# Patient Record
Sex: Female | Born: 1940
Health system: Southern US, Community
[De-identification: ages and names within clinical notes are randomized; demographics above are authoritative.]

## PROBLEM LIST (undated history)

## (undated) DIAGNOSIS — D649 Anemia, unspecified: Secondary | ICD-10-CM

## (undated) DIAGNOSIS — M1990 Unspecified osteoarthritis, unspecified site: Secondary | ICD-10-CM

## (undated) DIAGNOSIS — H269 Unspecified cataract: Secondary | ICD-10-CM

## (undated) DIAGNOSIS — K2270 Barrett's esophagus without dysplasia: Secondary | ICD-10-CM

## (undated) DIAGNOSIS — Z8489 Family history of other specified conditions: Secondary | ICD-10-CM

## (undated) DIAGNOSIS — K449 Diaphragmatic hernia without obstruction or gangrene: Secondary | ICD-10-CM

## (undated) DIAGNOSIS — M8580 Other specified disorders of bone density and structure, unspecified site: Secondary | ICD-10-CM

## (undated) DIAGNOSIS — K219 Gastro-esophageal reflux disease without esophagitis: Secondary | ICD-10-CM

## (undated) DIAGNOSIS — J45909 Unspecified asthma, uncomplicated: Secondary | ICD-10-CM

## (undated) DIAGNOSIS — K635 Polyp of colon: Secondary | ICD-10-CM

## (undated) HISTORY — DX: Gastro-esophageal reflux disease without esophagitis: K21.9

## (undated) HISTORY — DX: Polyp of colon: K63.5

## (undated) HISTORY — DX: Other specified disorders of bone density and structure, unspecified site: M85.80

## (undated) HISTORY — PX: EYE SURGERY: SHX253

## (undated) HISTORY — PX: CATARACT EXTRACTION: SUR2

## (undated) HISTORY — PX: MENISCUS REPAIR: SHX5179

## (undated) HISTORY — DX: Unspecified osteoarthritis, unspecified site: M19.90

## (undated) HISTORY — DX: Anemia, unspecified: D64.9

## (undated) HISTORY — DX: Unspecified cataract: H26.9

## (undated) HISTORY — DX: Unspecified asthma, uncomplicated: J45.909

## (undated) HISTORY — DX: Barrett's esophagus without dysplasia: K22.70

## (undated) HISTORY — DX: Diaphragmatic hernia without obstruction or gangrene: K44.9

## (undated) HISTORY — PX: TUBAL LIGATION: SHX77

---

## 2001-02-14 ENCOUNTER — Encounter: Admission: RE | Admit: 2001-02-14 | Discharge: 2001-02-14 | Payer: Self-pay | Admitting: Otolaryngology

## 2001-02-14 ENCOUNTER — Encounter: Payer: Self-pay | Admitting: Otolaryngology

## 2001-02-16 ENCOUNTER — Encounter: Payer: Self-pay | Admitting: Emergency Medicine

## 2001-02-16 ENCOUNTER — Encounter: Admission: RE | Admit: 2001-02-16 | Discharge: 2001-02-16 | Payer: Self-pay | Admitting: Emergency Medicine

## 2001-04-11 ENCOUNTER — Encounter (INDEPENDENT_AMBULATORY_CARE_PROVIDER_SITE_OTHER): Payer: Self-pay

## 2001-04-11 ENCOUNTER — Ambulatory Visit (HOSPITAL_COMMUNITY): Admission: RE | Admit: 2001-04-11 | Discharge: 2001-04-11 | Payer: Self-pay | Admitting: Gastroenterology

## 2002-02-21 ENCOUNTER — Encounter: Admission: RE | Admit: 2002-02-21 | Discharge: 2002-02-21 | Payer: Self-pay | Admitting: Emergency Medicine

## 2002-02-21 ENCOUNTER — Encounter: Payer: Self-pay | Admitting: Emergency Medicine

## 2003-03-07 ENCOUNTER — Encounter: Payer: Self-pay | Admitting: Emergency Medicine

## 2003-03-07 ENCOUNTER — Encounter: Admission: RE | Admit: 2003-03-07 | Discharge: 2003-03-07 | Payer: Self-pay | Admitting: Emergency Medicine

## 2004-03-07 ENCOUNTER — Encounter: Admission: RE | Admit: 2004-03-07 | Discharge: 2004-03-07 | Payer: Self-pay | Admitting: Emergency Medicine

## 2005-04-13 ENCOUNTER — Other Ambulatory Visit: Admission: RE | Admit: 2005-04-13 | Discharge: 2005-04-13 | Payer: Self-pay | Admitting: Emergency Medicine

## 2005-04-14 ENCOUNTER — Encounter: Admission: RE | Admit: 2005-04-14 | Discharge: 2005-04-14 | Payer: Self-pay | Admitting: Emergency Medicine

## 2006-05-26 ENCOUNTER — Encounter: Admission: RE | Admit: 2006-05-26 | Discharge: 2006-05-26 | Payer: Self-pay | Admitting: Emergency Medicine

## 2006-09-17 ENCOUNTER — Other Ambulatory Visit: Admission: RE | Admit: 2006-09-17 | Discharge: 2006-09-17 | Payer: Self-pay | Admitting: Emergency Medicine

## 2007-06-28 ENCOUNTER — Encounter: Admission: RE | Admit: 2007-06-28 | Discharge: 2007-06-28 | Payer: Self-pay | Admitting: Emergency Medicine

## 2008-08-15 ENCOUNTER — Encounter: Admission: RE | Admit: 2008-08-15 | Discharge: 2008-08-15 | Payer: Self-pay | Admitting: Emergency Medicine

## 2008-08-17 ENCOUNTER — Encounter: Admission: RE | Admit: 2008-08-17 | Discharge: 2008-08-17 | Payer: Self-pay | Admitting: Emergency Medicine

## 2009-10-17 ENCOUNTER — Encounter: Admission: RE | Admit: 2009-10-17 | Discharge: 2009-10-17 | Payer: Self-pay | Admitting: Family Medicine

## 2010-09-15 ENCOUNTER — Other Ambulatory Visit: Payer: Self-pay | Admitting: Nurse Practitioner

## 2010-09-15 DIAGNOSIS — Z1231 Encounter for screening mammogram for malignant neoplasm of breast: Secondary | ICD-10-CM

## 2010-10-03 NOTE — Op Note (Signed)
[facility]  Patient:    Tammy Harrell, Tammy Harrell Visit Number: [id number] MRN: [id number]          Service Type: END Location: ENDO Attending Physician:  Louie Bun Dictated by:   Everardo All Madilyn Fireman, M.D. Proc. Date: 04/11/01 Admit Date:  04/11/2001   CC:         Oley Balm. Georgina Pillion, M.D.   Operative Report  PROCEDURE:  Esophagogastroduodenoscopy with biopsy.  ENDOSCOPIST:  Everardo All. Madilyn Fireman, M.D.  INDICATIONS:  The patient has chronic reflux, known large hiatal hernia and reports of spitting or regurgitating some blood streaked material.  PROCEDURE:  Rule out refractory esophagitis, neoplasm of the GE junction or Barretts esophagus.  DESCRIPTION OF PROCEDURE:  The patient was placed in the left lateral decubitus position on the pulse monitor with continuous low flow oxygen delivered by nasal cannula.  She was sedated with 15 mg of IV Demerol and 6 mg of IV Versed.  The Olympus video endoscope was advanced under direct vision into the oropharynx and esophagus.  The esophagus was straight proximally and of normal caliber with the squamocolumnar line irregular in contour over a span of 35-38 cm from the incisors.  There appeared to be a hiatal hernia although the level of the hiatus and LES was vague, and therefore it was unclear to me whether the patient had Barretts esophagus.  Biopsies were taken of the most proximal areas of columnar epithelium.  There was no distinct ring, stricture or visible esophagitis.  The stomach was entered, and a small amount of liquid secretions were suctioned from the fundus.  A retroflexed view of the cardia showed a patulous gastroesophageal junction. Again, it was not absolutely clear that this represented a hiatal hernia.  The fundus, body, antrum and pylorus all appeared normal.  The duodenum was entered and both bulb and second portion were well inspected and appeared to be within normal limits.  The scope was then  withdrawn, and the patient returned to the recovery room in stable condition.  She tolerated the procedure well, and there were no immediate complications.  IMPRESSION: 1. Possible Barretts esophagus. 2. Probable hiatal hernia.  PLAN: Await histology to determine whether intestinal metaplasia is present. Dictated by:   Everardo All Madilyn Fireman, M.D. Attending Physician:  Louie Bun DD:  04/11/01 TD:  04/11/01 Job: [id number]

## 2010-10-03 NOTE — Procedures (Signed)
[facility]  Patient:    Tammy Harrell, Tammy Harrell Visit Number: [id number] MRN: [id number]          Service Type: END Location: ENDO Attending Physician:  Louie Bun Dictated by:   Everardo All Madilyn Fireman, M.D. Proc. Date: 04/11/01 Admit Date:  04/11/2001   CC:         Oley Balm. Georgina Pillion, M.D.   Procedure Report  PROCEDURE:  Colonoscopy with polypectomy.  INDICATION FOR PROCEDURE:  Screening colonoscopy in a patient undergoing EGD today.  DESCRIPTION OF PROCEDURE:  The patient was placed in the left lateral decubitus position and placed on the pulse monitor with continuous low-flow oxygen delivered by nasal cannula.  She was sedated with 20 mg of IV Demerol and 1 mg of IV Versed in addition to the medication received for the EGD.  The Olympus video colonoscope was inserted into the rectum and advanced to the cecum, confirmed by transillumination at McBurneys point and visualization of the ileocecal valve and appendiceal orifice.  The prep was excellent.  The cecum, ascending and transverse all appeared normal with no masses, polyps, diverticula or other mucosal abnormalities.  Within the descending and sigmoid colon, there were seen numerous diverticula and no other abnormalities. Within the rectum, there was a 6 mm polyp at 20 cm which was fulgurated by hot biopsy.  The remainder of the rectum appeared normal.  The colonoscope was then withdrawn, and the patient returned to the recovery room in stable condition.  She tolerated the procedure well, and there were no immediate complications.  IMPRESSION: 1. Small rectal polyp. 2. Left-sided diverticulosis.  PLAN:  Await biopsy results for determination of method and interval for future colon screening. Dictated by:   Everardo All Madilyn Fireman, M.D. Attending Physician:  Louie Bun DD:  04/11/01 TD:  04/11/01 Job: [id number]

## 2010-10-20 ENCOUNTER — Ambulatory Visit
Admission: RE | Admit: 2010-10-20 | Discharge: 2010-10-20 | Disposition: A | Discharge status: Home / Self Care | Payer: Medicare Other | Source: Ambulatory Visit | Attending: Nurse Practitioner | Admitting: Nurse Practitioner

## 2010-10-20 DIAGNOSIS — Z1231 Encounter for screening mammogram for malignant neoplasm of breast: Secondary | ICD-10-CM

## 2011-09-01 ENCOUNTER — Other Ambulatory Visit: Payer: Self-pay | Admitting: Gastroenterology

## 2011-10-26 ENCOUNTER — Other Ambulatory Visit: Payer: Self-pay | Admitting: Family Medicine

## 2011-10-26 DIAGNOSIS — Z1231 Encounter for screening mammogram for malignant neoplasm of breast: Secondary | ICD-10-CM

## 2011-11-03 ENCOUNTER — Ambulatory Visit
Admission: RE | Admit: 2011-11-03 | Discharge: 2011-11-03 | Disposition: A | Discharge status: Home / Self Care | Payer: Medicare Other | Source: Ambulatory Visit | Attending: Family Medicine | Admitting: Family Medicine

## 2011-11-03 DIAGNOSIS — Z1231 Encounter for screening mammogram for malignant neoplasm of breast: Secondary | ICD-10-CM

## 2011-11-26 ENCOUNTER — Other Ambulatory Visit: Payer: Self-pay | Admitting: Family Medicine

## 2011-11-26 DIAGNOSIS — E041 Nontoxic single thyroid nodule: Secondary | ICD-10-CM

## 2011-12-01 ENCOUNTER — Ambulatory Visit
Admission: RE | Admit: 2011-12-01 | Discharge: 2011-12-01 | Disposition: A | Discharge status: Home / Self Care | Payer: Medicare Other | Source: Ambulatory Visit | Attending: Family Medicine | Admitting: Family Medicine

## 2011-12-01 DIAGNOSIS — E041 Nontoxic single thyroid nodule: Secondary | ICD-10-CM

## 2011-12-03 ENCOUNTER — Other Ambulatory Visit: Payer: Self-pay | Admitting: Family Medicine

## 2011-12-03 DIAGNOSIS — E041 Nontoxic single thyroid nodule: Secondary | ICD-10-CM

## 2011-12-09 ENCOUNTER — Other Ambulatory Visit (HOSPITAL_COMMUNITY)
Admission: RE | Admit: 2011-12-09 | Discharge: 2011-12-09 | Disposition: A | Discharge status: Home / Self Care | Payer: Medicare Other | Source: Ambulatory Visit | Attending: Diagnostic Radiology | Admitting: Diagnostic Radiology

## 2011-12-09 ENCOUNTER — Ambulatory Visit
Admission: RE | Admit: 2011-12-09 | Discharge: 2011-12-09 | Disposition: A | Discharge status: Home / Self Care | Payer: Medicare Other | Source: Ambulatory Visit | Attending: Family Medicine | Admitting: Family Medicine

## 2011-12-09 DIAGNOSIS — E049 Nontoxic goiter, unspecified: Secondary | ICD-10-CM | POA: Insufficient documentation

## 2011-12-09 DIAGNOSIS — E041 Nontoxic single thyroid nodule: Secondary | ICD-10-CM

## 2012-12-01 ENCOUNTER — Other Ambulatory Visit: Payer: Self-pay

## 2012-12-01 DIAGNOSIS — Z1231 Encounter for screening mammogram for malignant neoplasm of breast: Secondary | ICD-10-CM

## 2012-12-30 ENCOUNTER — Ambulatory Visit
Admission: RE | Admit: 2012-12-30 | Discharge: 2012-12-30 | Disposition: A | Discharge status: Home / Self Care | Payer: Medicare Other | Source: Ambulatory Visit

## 2012-12-30 DIAGNOSIS — Z1231 Encounter for screening mammogram for malignant neoplasm of breast: Secondary | ICD-10-CM

## 2013-01-06 ENCOUNTER — Other Ambulatory Visit: Payer: Self-pay | Admitting: Physician Assistant

## 2013-08-24 ENCOUNTER — Other Ambulatory Visit: Payer: Self-pay | Admitting: Family Medicine

## 2013-08-24 ENCOUNTER — Ambulatory Visit
Admission: RE | Admit: 2013-08-24 | Discharge: 2013-08-24 | Disposition: A | Discharge status: Home / Self Care | Payer: Commercial Managed Care - HMO | Source: Ambulatory Visit | Attending: Family Medicine | Admitting: Family Medicine

## 2013-08-24 DIAGNOSIS — M25531 Pain in right wrist: Secondary | ICD-10-CM

## 2013-09-23 IMAGING — US US SOFT TISSUE HEAD/NECK
1 series · 14 of 25 positions shown · non-contrast
Comparison: None.

CLINICAL DATA: Cyst or nodule.

THYROID ULTRASOUND
TECHNIQUE: Ultrasound examination of the thyroid gland and adjacent
soft tissues was performed.

[Series 1: us soft tissue head/neck · 0.08mm/px · 14 of 47 slices shown]
[im 1/47]
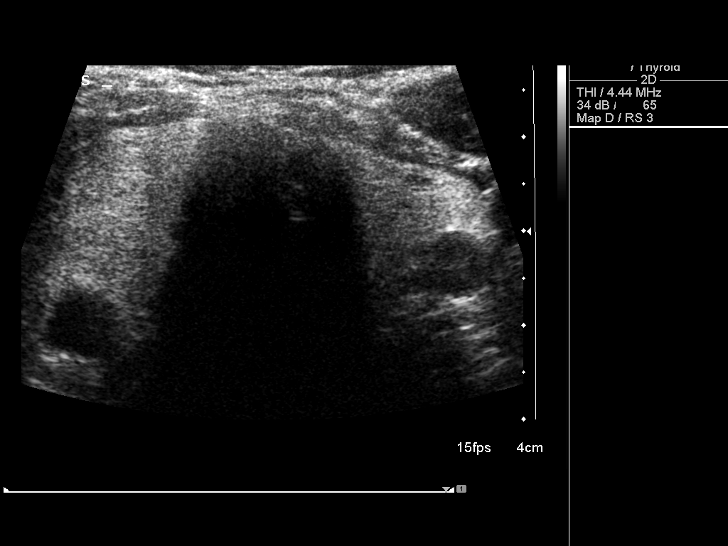
[im 4/47]
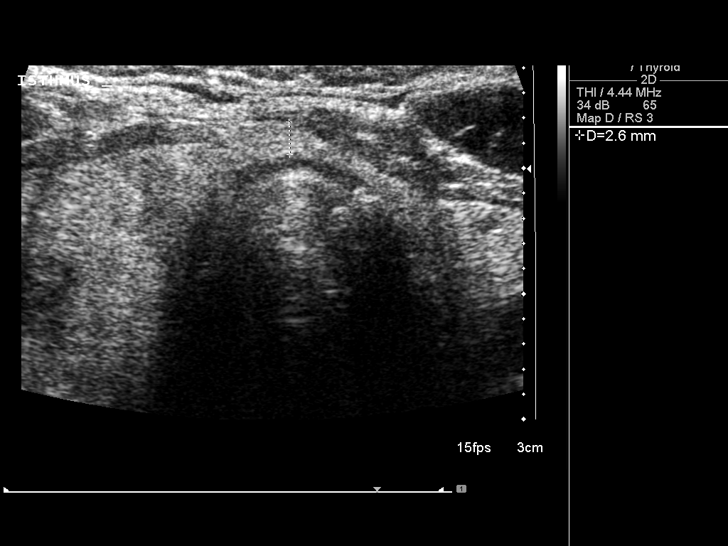
[im 8/47]
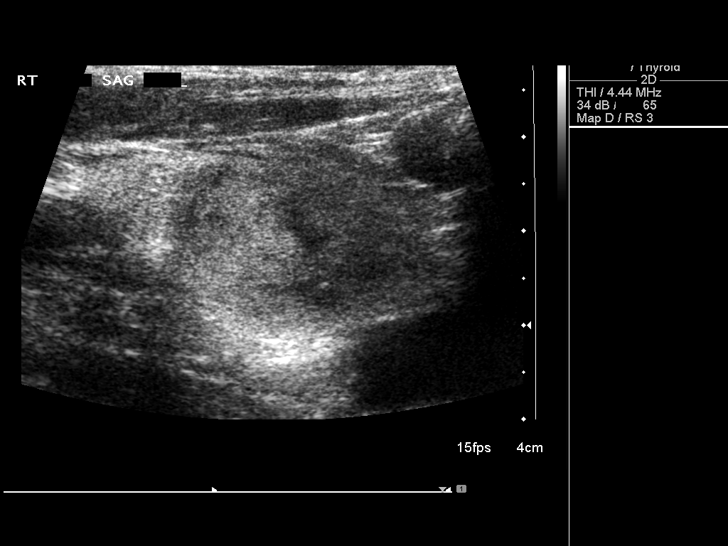
[im 12/47]
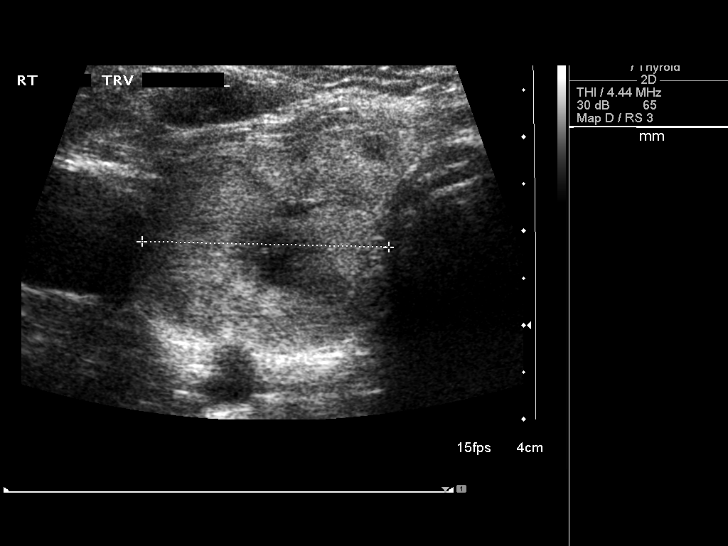
[im 16/47]
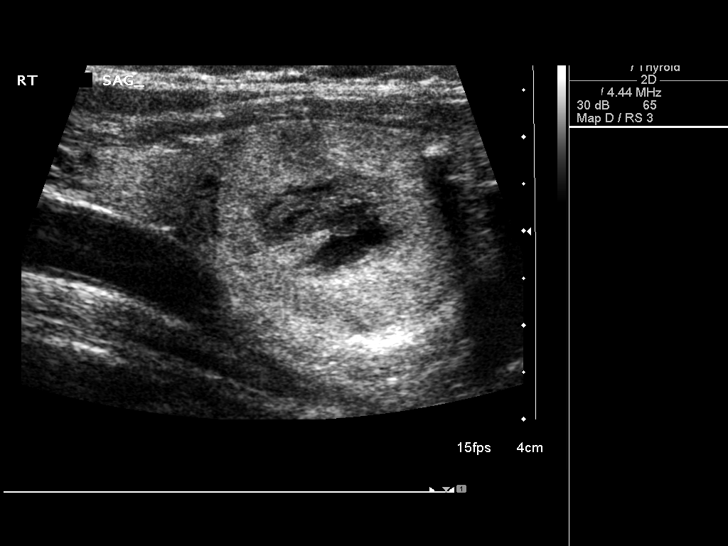
[im 18/47]
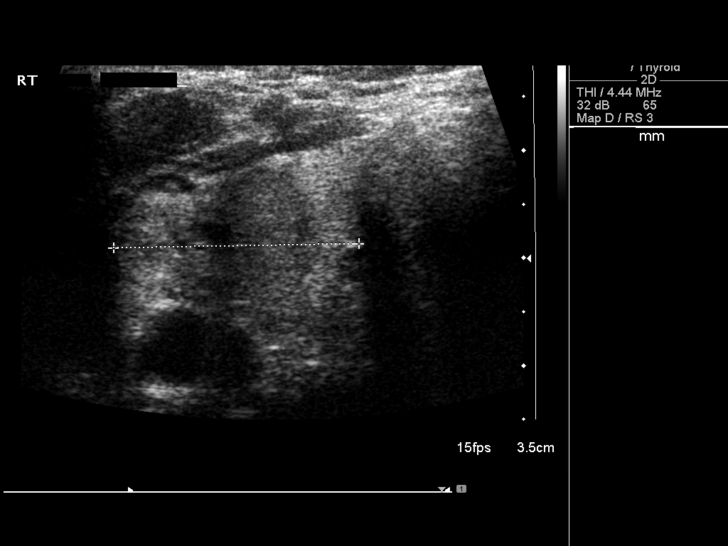
[im 22/47]
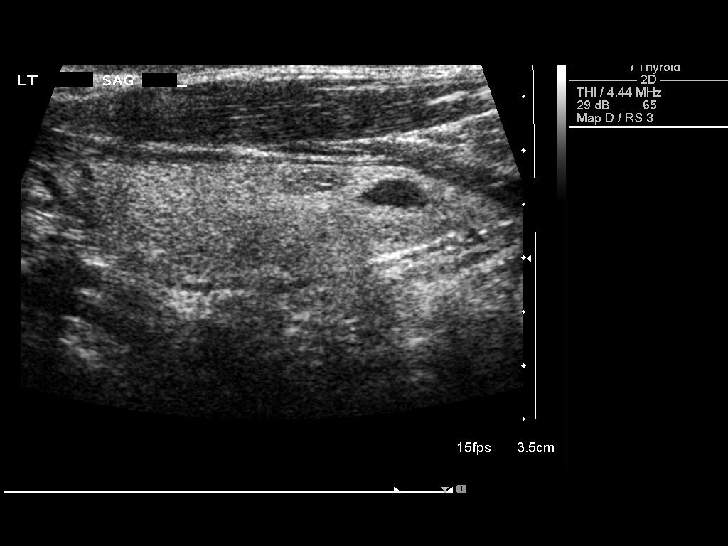
[im 25/47]
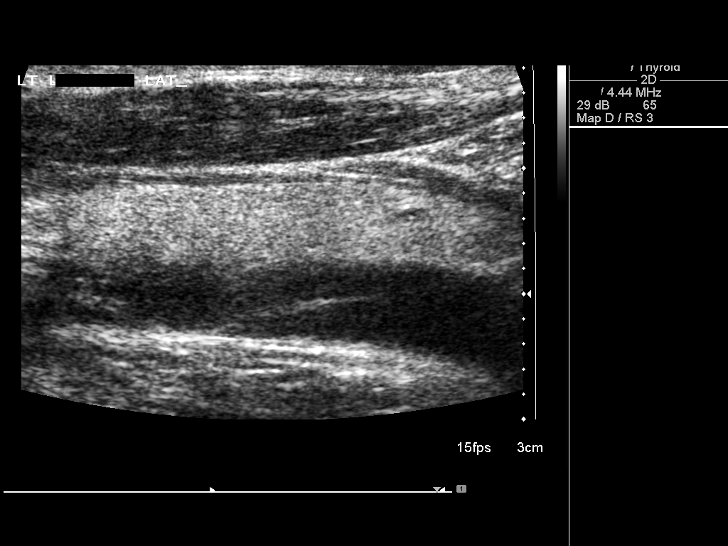
[im 29/47]
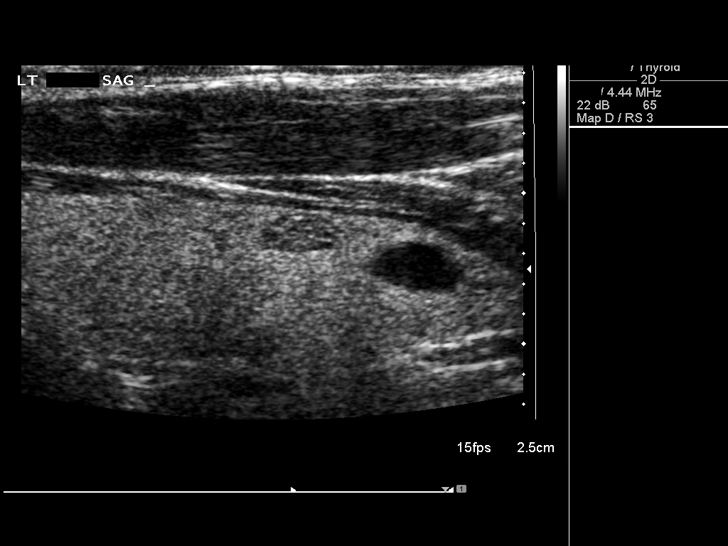
[im 31/47]
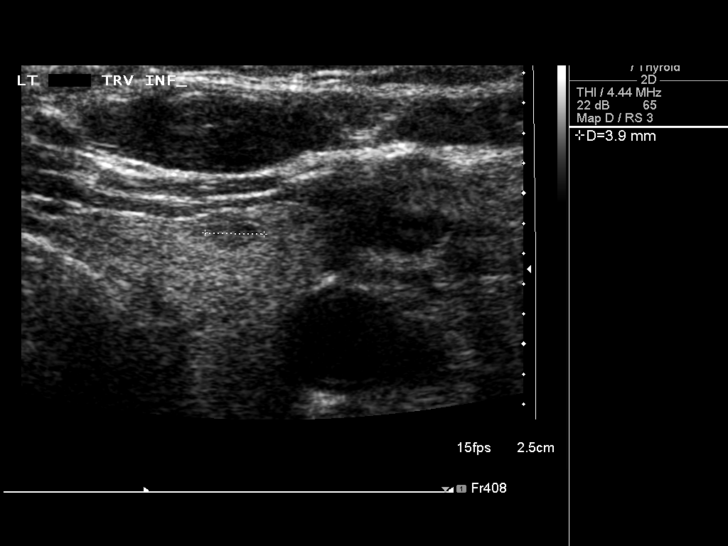
[im 35/47]
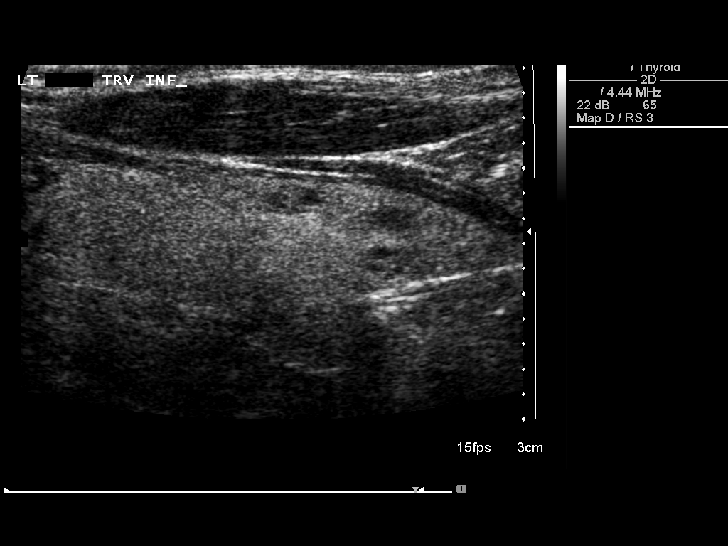
[im 39/47]
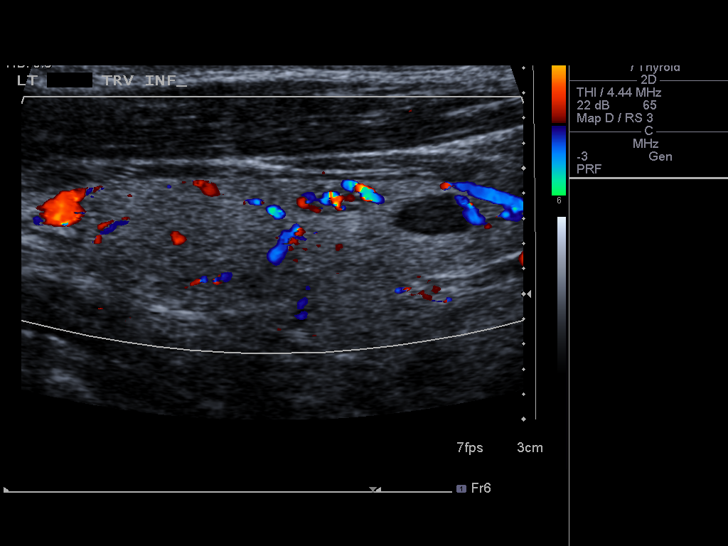
[im 43/47]
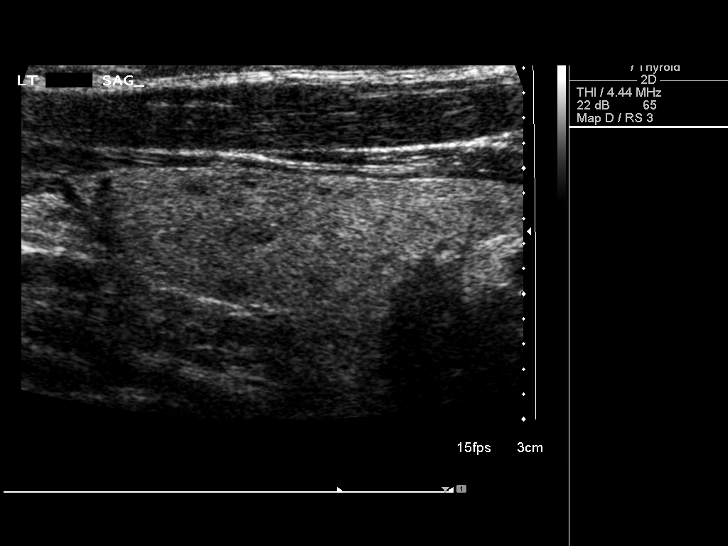
[im 47/47]
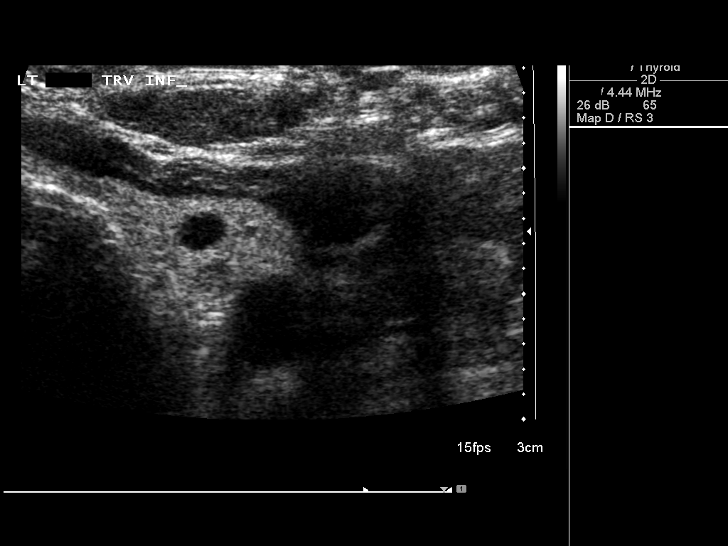

[14 of 25 positions shown; findings below may reference images not displayed]

FINDINGS: Right thyroid lobe:  23 x 27 x 48 mm, inhomogeneous background
echotexture
Left thyroid lobe:  13 x 17 x 42 mm
Isthmus:  3 mm in thickness

Focal nodules:  23 x 26 x 35 mm complex mostly solid with coarse
calcifications, inferior right
4 x 7 mm cyst, inferior left
Several smaller solid left lesions are identified measuring less
than 6 mm diameter.

Lymphadenopathy:  None visualized.
IMPRESSION: 1.  Dominant 3.5 cm right thyroid lesion. Findings meet consensus
criteria for biopsy.  Ultrasound-guided fine needle aspiration
should be considered, as per the consensus statement: Management of
Thyroid Nodules Detected at US:  [employer] in
800.

## 2013-10-18 ENCOUNTER — Ambulatory Visit (INDEPENDENT_AMBULATORY_CARE_PROVIDER_SITE_OTHER): Payer: Commercial Managed Care - HMO | Admitting: Podiatry

## 2013-10-18 ENCOUNTER — Encounter: Payer: Self-pay | Admitting: Podiatry

## 2013-10-18 VITALS — BP 148/76 | HR 75 | Resp 16

## 2013-10-18 DIAGNOSIS — L84 Corns and callosities: Secondary | ICD-10-CM

## 2013-10-18 NOTE — Progress Notes (Signed)
Subjective:     Patient ID: Tammy Harrell, female   DOB: Feb 14, 1941, 73 y.o.   MRN: [id number]  HPI patient has 3 lesions on the bottom of the left foot that are tender when pressed   Review of Systems     Objective:   Physical Exam Neurovascular status intact with keratotic lesion x3 plantar left    Assessment:     Keratotic lesion formation plantar left    Plan:     Debris lesions on left foot with no iatrogenic bleeding noted

## 2014-09-08 ENCOUNTER — Encounter (HOSPITAL_COMMUNITY): Payer: Self-pay | Admitting: Emergency Medicine

## 2014-09-08 ENCOUNTER — Emergency Department (HOSPITAL_COMMUNITY): Payer: PPO

## 2014-09-08 ENCOUNTER — Emergency Department (HOSPITAL_COMMUNITY)
Admission: EM | Admit: 2014-09-08 | Discharge: 2014-09-08 | Disposition: A | Discharge status: Home / Self Care | Payer: PPO | Attending: Emergency Medicine | Admitting: Emergency Medicine

## 2014-09-08 DIAGNOSIS — S299XXA Unspecified injury of thorax, initial encounter: Secondary | ICD-10-CM | POA: Diagnosis present

## 2014-09-08 DIAGNOSIS — R0789 Other chest pain: Secondary | ICD-10-CM

## 2014-09-08 DIAGNOSIS — Y998 Other external cause status: Secondary | ICD-10-CM | POA: Insufficient documentation

## 2014-09-08 DIAGNOSIS — Z9849 Cataract extraction status, unspecified eye: Secondary | ICD-10-CM | POA: Insufficient documentation

## 2014-09-08 DIAGNOSIS — Z8739 Personal history of other diseases of the musculoskeletal system and connective tissue: Secondary | ICD-10-CM | POA: Diagnosis not present

## 2014-09-08 DIAGNOSIS — Y92096 Garden or yard of other non-institutional residence as the place of occurrence of the external cause: Secondary | ICD-10-CM | POA: Diagnosis not present

## 2014-09-08 DIAGNOSIS — W010XXA Fall on same level from slipping, tripping and stumbling without subsequent striking against object, initial encounter: Secondary | ICD-10-CM | POA: Diagnosis not present

## 2014-09-08 DIAGNOSIS — Y93H2 Activity, gardening and landscaping: Secondary | ICD-10-CM | POA: Diagnosis not present

## 2014-09-08 DIAGNOSIS — S3992XA Unspecified injury of lower back, initial encounter: Secondary | ICD-10-CM | POA: Insufficient documentation

## 2014-09-08 DIAGNOSIS — Z8719 Personal history of other diseases of the digestive system: Secondary | ICD-10-CM | POA: Diagnosis not present

## 2014-09-08 DIAGNOSIS — W19XXXA Unspecified fall, initial encounter: Secondary | ICD-10-CM

## 2014-09-08 MED ORDER — TRAMADOL HCL 50 MG PO TABS: 50.0000 mg | ORAL_TABLET | Freq: Four times a day (QID) | ORAL | Status: DC | PRN

## 2014-09-08 MED ORDER — TRAMADOL HCL 50 MG PO TABS
50.0000 mg | ORAL_TABLET | Freq: Once | ORAL | Status: AC
  Administered 2014-09-08: 50 mg via ORAL
  Filled 2014-09-08: qty 1

## 2014-09-08 NOTE — ED Provider Notes (Signed)
CSN: [id number]     Arrival date & time 09/08/14  0023 History   First MD Initiated Contact with Patient 09/08/14 0129     Chief Complaint  Patient presents with  . Fall  . Back Pain    (Consider location/radiation/quality/duration/timing/severity/associated sxs/prior Treatment) HPI Comments: Patient is a 74 year old female with a history of esophageal reflux and osteopenia who presents to the emergency department for further evaluation of left-sided back pain. Patient states that she was mowing her lawn yesterday on a riding lawnmower when the mower tipped on its side causing her to fall on her left side. She states that she had no pain at the time of injury and denies hitting her head as well as losing consciousness. She was able to finish mowing the yard and continued on her day normally. Patient states that it wasn't until this evening that she was getting ready for bed and felt as though something "snapped". She states that she went to sleep and awoke 2 hours later to let her dog out. At this time, the pain acutely worsened. She has pain with certain position changes as well as with laughing. She denies pain with deep breathing as well as shortness of breath. No medications taken prior to arrival for symptoms. Patient has had no bowel/bladder incontinence, hematuria, or extremity numbness/weakness.  Patient is a 74 y.o. female presenting with fall and back pain. The history is provided by the patient. No language interpreter was used.  Fall  Back Pain   Past Medical History  Diagnosis Date  . Cataract   . GERD (gastroesophageal reflux disease)   . Osteopenia    Past Surgical History  Procedure Laterality Date  . Tubal ligation    . Eye surgery    . Cataract extraction     No family history on file. History  Substance Use Topics  . Smoking status: Never Smoker   . Smokeless tobacco: Not on file  . Alcohol Use: No   OB History    No data available      Review of Systems   Musculoskeletal: Positive for back pain.  All other systems reviewed and are negative.   Allergies  Codeine  Home Medications   Prior to Admission medications   Medication Sig Start Date End Date Taking? Authorizing Provider  traMADol (ULTRAM) 50 MG tablet Take 1 tablet (50 mg total) by mouth every 6 (six) hours as needed. 09/08/14   Antonietta Breach, PA-C   BP 125/78 mmHg  Pulse 70  Temp(Src) 97.9 F (36.6 C) (Oral)  Resp 14  Ht 5\' 6"  (1.676 m)  Wt 178 lb (80.74 kg)  BMI 28.74 kg/m2  SpO2 100%   Physical Exam  Constitutional: She is oriented to person, place, and time. She appears well-developed and well-nourished. No distress.  Pleasant and joking with daughter. Nontoxic/nonseptic appearing.  HENT:  Head: Normocephalic and atraumatic.  Eyes: Conjunctivae and EOM are normal. No scleral icterus.  Neck: Normal range of motion.  Cardiovascular: Normal rate, regular rhythm and intact distal pulses.   Pulmonary/Chest: Effort normal and breath sounds normal. No respiratory distress. She has no wheezes. She has no rales. She exhibits tenderness.  TTP to L lateral chest wall without crepitus or deformity. Chest expansion symmetric. Lungs CTAB.  Musculoskeletal: Normal range of motion. She exhibits tenderness.  See chest wall exam. No TTP to the thoracic or lumbar midline. No bony deformities, step offs, or crepitus.  Neurological: She is alert and oriented to person, place, and  time. She exhibits normal muscle tone. Coordination normal.  GCS 15. Speech is goal oriented. Patient moves extremities without ataxia.  Skin: Skin is warm and dry. No rash noted. She is not diaphoretic. No erythema. No pallor.  Psychiatric: She has a normal mood and affect. Her behavior is normal.  Nursing note and vitals reviewed.   ED Course  Procedures (including critical care time) Labs Review Labs Reviewed - No data to display  Imaging Review Dg Ribs Unilateral W/chest Left  09/08/2014   CLINICAL  DATA:  Left rib and back pain after falling from a lawnmower yesterday.  EXAM: LEFT RIBS AND CHEST - 3+ VIEW  COMPARISON:  None.  FINDINGS: No fracture or other bone lesions are seen involving the ribs. There is no evidence of pneumothorax or pleural effusion. There is slight scarring or atelectasis at the left lung base. Lungs are otherwise clear. Heart size and vascularity are normal.  IMPRESSION: Minimal scarring or atelectasis the left lung base. No visible rib fractures.   Electronically Signed   By: Lorriane Shire M.D.   On: 09/08/2014 02:19   Dg Lumbar Spine Complete  09/08/2014   CLINICAL DATA:  Status post lawnmower accident. Felt popping sensation at lower to mid back. Initial encounter.  EXAM: LUMBAR SPINE - COMPLETE 4+ VIEW  COMPARISON:  MRI of the lumbar spine performed 08/15/2008  FINDINGS: There is no evidence of fracture or subluxation. Vertebral bodies demonstrate normal height and alignment. Intervertebral disc spaces are preserved. The visualized neural foramina are grossly unremarkable in appearance.  The visualized bowel gas pattern is unremarkable in appearance; air and stool are noted within the colon. The sacroiliac joints are within normal limits.  IMPRESSION: No evidence of fracture or subluxation along the lumbar spine.   Electronically Signed   By: Garald Balding M.D.   On: 09/08/2014 02:18     EKG Interpretation None      MDM   Final diagnoses:  Fall  Left-sided chest wall pain    74 year old female presents to the emergency department for further evaluation of injuries following a fall. Patient denies head trauma or loss of consciousness during the fall. Patient is pleasant and well-appearing. She is neurovascularly intact. No red flags or signs concerning for cauda equina. Patient complains of pain to her left lateral chest wall. Lungs are clear to auscultation bilaterally. No hypoxia.  X-rays today showed no evidence of fracture, dislocation, or bony deformity. No  evidence of pneumothorax or rib fracture. Have discussed with the patient that she may have a hairline fracture to a rib, not visible on x-ray. Will manage as outpatient with pain medication and incentive spirometer to promote deep breathing. Primary care follow-up advised in one week and return precautions given. Patient agreeable to plan with no unaddressed concerns. Patient discharged in good condition; VSS.   Filed Vitals:   09/08/14 0051 09/08/14 0130 09/08/14 0230  BP: 161/71 139/51 125/78  Pulse: 68 61 70  Temp: 97.9 F (36.6 C)    TempSrc: Oral    Resp: 14    Height: 5\' 6"  (1.676 m)    Weight: 178 lb (80.74 kg)    SpO2: 97% 100% 100%     Antonietta Breach, PA-C 09/08/14 6808  Rolland Porter, MD 09/08/14 [phone]

## 2014-09-08 NOTE — Discharge Instructions (Signed)
Your chest x-ray does not show a rib fracture, though it is possible that you may still have a hairline fracture to one of your ribs. Recommend that you take ibuprofen or Tylenol for persistent pain. Alternate ice and heat to the area of injury. Use an incentive spirometer once every 1-2 hours to ensure that you are taking deep breaths. You may take tramadol as needed for severe pain. Follow-up with your doctor in 1 week for a recheck of symptoms. Return to the emergency department if symptoms worsen.  Chest Wall Pain Chest wall pain is pain in or around the bones and muscles of your chest. It may take up to 6 weeks to get better. It may take longer if you must stay physically active in your work and activities.  CAUSES  Chest wall pain may happen on its own. However, it may be caused by:  A viral illness like the flu.  Injury.  Coughing.  Exercise.  Arthritis.  Fibromyalgia.  Shingles. HOME CARE INSTRUCTIONS   Avoid overtiring physical activity. Try not to strain or perform activities that cause pain. This includes any activities using your chest or your abdominal and side muscles, especially if heavy weights are used.  Put ice on the sore area.  Put ice in a plastic bag.  Place a towel between your skin and the bag.  Leave the ice on for 15-20 minutes per hour while awake for the first 2 days.  Only take over-the-counter or prescription medicines for pain, discomfort, or fever as directed by your caregiver. SEEK IMMEDIATE MEDICAL CARE IF:   Your pain increases, or you are very uncomfortable.  You have a fever.  Your chest pain becomes worse.  You have new, unexplained symptoms.  You have nausea or vomiting.  You feel sweaty or lightheaded.  You have a cough with phlegm (sputum), or you cough up blood. MAKE SURE YOU:   Understand these instructions.  Will watch your condition.  Will get help right away if you are not doing well or get worse. Document Released:  05/04/2005 Document Revised: 07/27/2011 Document Reviewed: 12/29/2010 [facility] Patient Information 2015 [place], [place]. This information is not intended to replace advice given to you by your health care provider. Make sure you discuss any questions you have with your health care provider.

## 2014-09-08 NOTE — ED Notes (Signed)
Pt. fell from her riding mower yesterday , no LOC / ambulatory , reports pain at left lower back . Denies hematuria . Alert and oriented/ respirations unlabored .

## 2015-05-29 DIAGNOSIS — E559 Vitamin D deficiency, unspecified: Secondary | ICD-10-CM | POA: Diagnosis not present

## 2015-05-29 DIAGNOSIS — R7989 Other specified abnormal findings of blood chemistry: Secondary | ICD-10-CM | POA: Diagnosis not present

## 2015-06-04 DIAGNOSIS — K219 Gastro-esophageal reflux disease without esophagitis: Secondary | ICD-10-CM | POA: Diagnosis not present

## 2015-06-12 DIAGNOSIS — Z0000 Encounter for general adult medical examination without abnormal findings: Secondary | ICD-10-CM | POA: Diagnosis not present

## 2015-06-12 DIAGNOSIS — M8580 Other specified disorders of bone density and structure, unspecified site: Secondary | ICD-10-CM | POA: Diagnosis not present

## 2015-06-12 DIAGNOSIS — E7800 Pure hypercholesterolemia, unspecified: Secondary | ICD-10-CM | POA: Diagnosis not present

## 2015-07-02 DIAGNOSIS — R5383 Other fatigue: Secondary | ICD-10-CM | POA: Diagnosis not present

## 2015-07-11 DIAGNOSIS — R5383 Other fatigue: Secondary | ICD-10-CM | POA: Diagnosis not present

## 2015-08-05 DIAGNOSIS — R5383 Other fatigue: Secondary | ICD-10-CM | POA: Diagnosis not present

## 2015-09-10 DIAGNOSIS — R7989 Other specified abnormal findings of blood chemistry: Secondary | ICD-10-CM | POA: Diagnosis not present

## 2015-09-10 DIAGNOSIS — R5383 Other fatigue: Secondary | ICD-10-CM | POA: Diagnosis not present

## 2015-09-10 DIAGNOSIS — E559 Vitamin D deficiency, unspecified: Secondary | ICD-10-CM | POA: Diagnosis not present

## 2015-09-13 DIAGNOSIS — Z0100 Encounter for examination of eyes and vision without abnormal findings: Secondary | ICD-10-CM | POA: Diagnosis not present

## 2015-09-13 DIAGNOSIS — Z961 Presence of intraocular lens: Secondary | ICD-10-CM | POA: Diagnosis not present

## 2015-09-18 DIAGNOSIS — R5383 Other fatigue: Secondary | ICD-10-CM | POA: Diagnosis not present

## 2015-10-30 DIAGNOSIS — R5383 Other fatigue: Secondary | ICD-10-CM | POA: Diagnosis not present

## 2015-12-04 DIAGNOSIS — R5383 Other fatigue: Secondary | ICD-10-CM | POA: Diagnosis not present

## 2015-12-31 DIAGNOSIS — E559 Vitamin D deficiency, unspecified: Secondary | ICD-10-CM | POA: Diagnosis not present

## 2015-12-31 DIAGNOSIS — R7989 Other specified abnormal findings of blood chemistry: Secondary | ICD-10-CM | POA: Diagnosis not present

## 2015-12-31 DIAGNOSIS — R5383 Other fatigue: Secondary | ICD-10-CM | POA: Diagnosis not present

## 2016-01-08 DIAGNOSIS — R5383 Other fatigue: Secondary | ICD-10-CM | POA: Diagnosis not present

## 2016-02-11 DIAGNOSIS — R5383 Other fatigue: Secondary | ICD-10-CM | POA: Diagnosis not present

## 2016-02-24 DIAGNOSIS — M8588 Other specified disorders of bone density and structure, other site: Secondary | ICD-10-CM | POA: Diagnosis not present

## 2016-03-11 DIAGNOSIS — L821 Other seborrheic keratosis: Secondary | ICD-10-CM | POA: Diagnosis not present

## 2016-03-11 DIAGNOSIS — D239 Other benign neoplasm of skin, unspecified: Secondary | ICD-10-CM | POA: Diagnosis not present

## 2016-06-22 DIAGNOSIS — R7989 Other specified abnormal findings of blood chemistry: Secondary | ICD-10-CM | POA: Diagnosis not present

## 2016-06-22 DIAGNOSIS — R5383 Other fatigue: Secondary | ICD-10-CM | POA: Diagnosis not present

## 2016-06-22 DIAGNOSIS — E559 Vitamin D deficiency, unspecified: Secondary | ICD-10-CM | POA: Diagnosis not present

## 2016-06-22 DIAGNOSIS — N951 Menopausal and female climacteric states: Secondary | ICD-10-CM | POA: Diagnosis not present

## 2016-06-23 DIAGNOSIS — N951 Menopausal and female climacteric states: Secondary | ICD-10-CM | POA: Diagnosis not present

## 2016-06-23 DIAGNOSIS — R5383 Other fatigue: Secondary | ICD-10-CM | POA: Diagnosis not present

## 2016-07-01 DIAGNOSIS — R5383 Other fatigue: Secondary | ICD-10-CM | POA: Diagnosis not present

## 2016-08-12 DIAGNOSIS — R5383 Other fatigue: Secondary | ICD-10-CM | POA: Diagnosis not present

## 2016-09-04 DIAGNOSIS — H9011 Conductive hearing loss, unilateral, right ear, with unrestricted hearing on the contralateral side: Secondary | ICD-10-CM | POA: Diagnosis not present

## 2016-09-04 DIAGNOSIS — H6521 Chronic serous otitis media, right ear: Secondary | ICD-10-CM | POA: Diagnosis not present

## 2016-09-10 DIAGNOSIS — H6521 Chronic serous otitis media, right ear: Secondary | ICD-10-CM | POA: Diagnosis not present

## 2016-09-10 DIAGNOSIS — H9011 Conductive hearing loss, unilateral, right ear, with unrestricted hearing on the contralateral side: Secondary | ICD-10-CM | POA: Diagnosis not present

## 2016-09-18 DIAGNOSIS — H52203 Unspecified astigmatism, bilateral: Secondary | ICD-10-CM | POA: Diagnosis not present

## 2016-09-18 DIAGNOSIS — Z961 Presence of intraocular lens: Secondary | ICD-10-CM | POA: Diagnosis not present

## 2016-10-14 DIAGNOSIS — Z9622 Myringotomy tube(s) status: Secondary | ICD-10-CM | POA: Diagnosis not present

## 2016-10-14 DIAGNOSIS — H6521 Chronic serous otitis media, right ear: Secondary | ICD-10-CM | POA: Diagnosis not present

## 2016-10-14 DIAGNOSIS — H9011 Conductive hearing loss, unilateral, right ear, with unrestricted hearing on the contralateral side: Secondary | ICD-10-CM | POA: Diagnosis not present

## 2016-10-21 DIAGNOSIS — R5383 Other fatigue: Secondary | ICD-10-CM | POA: Diagnosis not present

## 2016-11-02 DIAGNOSIS — M8580 Other specified disorders of bone density and structure, unspecified site: Secondary | ICD-10-CM | POA: Diagnosis not present

## 2016-11-02 DIAGNOSIS — R5383 Other fatigue: Secondary | ICD-10-CM | POA: Diagnosis not present

## 2016-11-02 DIAGNOSIS — E7800 Pure hypercholesterolemia, unspecified: Secondary | ICD-10-CM | POA: Diagnosis not present

## 2016-11-02 DIAGNOSIS — R7989 Other specified abnormal findings of blood chemistry: Secondary | ICD-10-CM | POA: Diagnosis not present

## 2016-11-02 DIAGNOSIS — K219 Gastro-esophageal reflux disease without esophagitis: Secondary | ICD-10-CM | POA: Diagnosis not present

## 2016-11-02 DIAGNOSIS — Z0000 Encounter for general adult medical examination without abnormal findings: Secondary | ICD-10-CM | POA: Diagnosis not present

## 2016-11-02 DIAGNOSIS — E559 Vitamin D deficiency, unspecified: Secondary | ICD-10-CM | POA: Diagnosis not present

## 2016-11-11 DIAGNOSIS — R5383 Other fatigue: Secondary | ICD-10-CM | POA: Diagnosis not present

## 2016-12-08 DIAGNOSIS — D126 Benign neoplasm of colon, unspecified: Secondary | ICD-10-CM | POA: Diagnosis not present

## 2016-12-08 DIAGNOSIS — Z86010 Personal history of colonic polyps: Secondary | ICD-10-CM | POA: Diagnosis not present

## 2016-12-08 DIAGNOSIS — K5730 Diverticulosis of large intestine without perforation or abscess without bleeding: Secondary | ICD-10-CM | POA: Diagnosis not present

## 2016-12-08 LAB — HM COLONOSCOPY

## 2016-12-10 DIAGNOSIS — D126 Benign neoplasm of colon, unspecified: Secondary | ICD-10-CM | POA: Diagnosis not present

## 2017-02-17 DIAGNOSIS — R5383 Other fatigue: Secondary | ICD-10-CM | POA: Diagnosis not present

## 2017-05-09 DIAGNOSIS — R5383 Other fatigue: Secondary | ICD-10-CM | POA: Diagnosis not present

## 2017-05-24 DIAGNOSIS — H6521 Chronic serous otitis media, right ear: Secondary | ICD-10-CM | POA: Diagnosis not present

## 2017-05-24 DIAGNOSIS — Z9622 Myringotomy tube(s) status: Secondary | ICD-10-CM | POA: Diagnosis not present

## 2017-05-29 DIAGNOSIS — R7989 Other specified abnormal findings of blood chemistry: Secondary | ICD-10-CM | POA: Diagnosis not present

## 2017-05-29 DIAGNOSIS — R5383 Other fatigue: Secondary | ICD-10-CM | POA: Diagnosis not present

## 2017-05-29 DIAGNOSIS — E559 Vitamin D deficiency, unspecified: Secondary | ICD-10-CM | POA: Diagnosis not present

## 2017-06-07 DIAGNOSIS — R5383 Other fatigue: Secondary | ICD-10-CM | POA: Diagnosis not present

## 2017-09-06 DIAGNOSIS — R5383 Other fatigue: Secondary | ICD-10-CM | POA: Diagnosis not present

## 2017-09-06 DIAGNOSIS — R7989 Other specified abnormal findings of blood chemistry: Secondary | ICD-10-CM | POA: Diagnosis not present

## 2017-09-06 DIAGNOSIS — E559 Vitamin D deficiency, unspecified: Secondary | ICD-10-CM | POA: Diagnosis not present

## 2017-09-13 DIAGNOSIS — R5383 Other fatigue: Secondary | ICD-10-CM | POA: Diagnosis not present

## 2017-11-08 DIAGNOSIS — E041 Nontoxic single thyroid nodule: Secondary | ICD-10-CM | POA: Diagnosis not present

## 2017-11-08 DIAGNOSIS — Z0000 Encounter for general adult medical examination without abnormal findings: Secondary | ICD-10-CM | POA: Diagnosis not present

## 2017-11-08 DIAGNOSIS — M8580 Other specified disorders of bone density and structure, unspecified site: Secondary | ICD-10-CM | POA: Diagnosis not present

## 2017-11-08 DIAGNOSIS — E7800 Pure hypercholesterolemia, unspecified: Secondary | ICD-10-CM | POA: Diagnosis not present

## 2017-11-09 ENCOUNTER — Other Ambulatory Visit: Payer: Self-pay | Admitting: Family Medicine

## 2017-11-09 DIAGNOSIS — E041 Nontoxic single thyroid nodule: Secondary | ICD-10-CM

## 2017-11-15 DIAGNOSIS — H9011 Conductive hearing loss, unilateral, right ear, with unrestricted hearing on the contralateral side: Secondary | ICD-10-CM | POA: Diagnosis not present

## 2017-11-15 DIAGNOSIS — H6521 Chronic serous otitis media, right ear: Secondary | ICD-10-CM | POA: Diagnosis not present

## 2017-11-17 DIAGNOSIS — R5383 Other fatigue: Secondary | ICD-10-CM | POA: Diagnosis not present

## 2017-11-23 ENCOUNTER — Ambulatory Visit
Admission: RE | Admit: 2017-11-23 | Discharge: 2017-11-23 | Disposition: A | Discharge status: Home / Self Care | Payer: PPO | Source: Ambulatory Visit | Attending: Family Medicine | Admitting: Family Medicine

## 2017-11-23 DIAGNOSIS — E041 Nontoxic single thyroid nodule: Secondary | ICD-10-CM | POA: Diagnosis not present

## 2017-12-21 DIAGNOSIS — R5383 Other fatigue: Secondary | ICD-10-CM | POA: Diagnosis not present

## 2018-01-25 DIAGNOSIS — R5383 Other fatigue: Secondary | ICD-10-CM | POA: Diagnosis not present

## 2018-02-07 DIAGNOSIS — H6521 Chronic serous otitis media, right ear: Secondary | ICD-10-CM | POA: Diagnosis not present

## 2018-02-07 DIAGNOSIS — H9011 Conductive hearing loss, unilateral, right ear, with unrestricted hearing on the contralateral side: Secondary | ICD-10-CM | POA: Diagnosis not present

## 2018-03-01 DIAGNOSIS — R5383 Other fatigue: Secondary | ICD-10-CM | POA: Diagnosis not present

## 2018-04-05 DIAGNOSIS — R5383 Other fatigue: Secondary | ICD-10-CM | POA: Diagnosis not present

## 2018-05-31 DIAGNOSIS — R5383 Other fatigue: Secondary | ICD-10-CM | POA: Diagnosis not present

## 2018-06-01 ENCOUNTER — Other Ambulatory Visit: Payer: Self-pay | Admitting: Surgery

## 2018-06-01 ENCOUNTER — Ambulatory Visit
Admission: RE | Admit: 2018-06-01 | Discharge: 2018-06-01 | Disposition: A | Discharge status: Home / Self Care | Payer: PPO | Source: Ambulatory Visit | Attending: Surgery | Admitting: Surgery

## 2018-06-01 DIAGNOSIS — M1711 Unilateral primary osteoarthritis, right knee: Secondary | ICD-10-CM | POA: Diagnosis not present

## 2018-06-01 DIAGNOSIS — M25561 Pain in right knee: Secondary | ICD-10-CM

## 2018-06-03 DIAGNOSIS — M25561 Pain in right knee: Secondary | ICD-10-CM | POA: Diagnosis not present

## 2018-07-06 DIAGNOSIS — R5383 Other fatigue: Secondary | ICD-10-CM | POA: Diagnosis not present

## 2018-08-08 DIAGNOSIS — R5383 Other fatigue: Secondary | ICD-10-CM | POA: Diagnosis not present

## 2018-09-08 DIAGNOSIS — E7210 Disorders of sulfur-bearing amino-acid metabolism, unspecified: Secondary | ICD-10-CM | POA: Diagnosis not present

## 2018-09-08 DIAGNOSIS — E559 Vitamin D deficiency, unspecified: Secondary | ICD-10-CM | POA: Diagnosis not present

## 2018-09-08 DIAGNOSIS — R7989 Other specified abnormal findings of blood chemistry: Secondary | ICD-10-CM | POA: Diagnosis not present

## 2018-09-08 DIAGNOSIS — R5383 Other fatigue: Secondary | ICD-10-CM | POA: Diagnosis not present

## 2018-09-14 DIAGNOSIS — R5383 Other fatigue: Secondary | ICD-10-CM | POA: Diagnosis not present

## 2018-10-26 DIAGNOSIS — R5383 Other fatigue: Secondary | ICD-10-CM | POA: Diagnosis not present

## 2018-12-06 ENCOUNTER — Other Ambulatory Visit: Payer: Self-pay | Admitting: Family Medicine

## 2018-12-06 DIAGNOSIS — R5383 Other fatigue: Secondary | ICD-10-CM | POA: Diagnosis not present

## 2018-12-06 DIAGNOSIS — E041 Nontoxic single thyroid nodule: Secondary | ICD-10-CM

## 2018-12-06 DIAGNOSIS — R7989 Other specified abnormal findings of blood chemistry: Secondary | ICD-10-CM | POA: Diagnosis not present

## 2018-12-06 DIAGNOSIS — E559 Vitamin D deficiency, unspecified: Secondary | ICD-10-CM | POA: Diagnosis not present

## 2018-12-07 DIAGNOSIS — Z0001 Encounter for general adult medical examination with abnormal findings: Secondary | ICD-10-CM | POA: Diagnosis not present

## 2018-12-08 ENCOUNTER — Other Ambulatory Visit: Payer: Self-pay

## 2018-12-08 ENCOUNTER — Ambulatory Visit
Admission: RE | Admit: 2018-12-08 | Discharge: 2018-12-08 | Disposition: A | Discharge status: Home / Self Care | Payer: PPO | Source: Ambulatory Visit | Attending: Family Medicine | Admitting: Family Medicine

## 2018-12-08 DIAGNOSIS — E041 Nontoxic single thyroid nodule: Secondary | ICD-10-CM

## 2018-12-08 DIAGNOSIS — E042 Nontoxic multinodular goiter: Secondary | ICD-10-CM | POA: Diagnosis not present

## 2018-12-13 DIAGNOSIS — R5383 Other fatigue: Secondary | ICD-10-CM | POA: Diagnosis not present

## 2018-12-20 DIAGNOSIS — R5383 Other fatigue: Secondary | ICD-10-CM | POA: Diagnosis not present

## 2019-01-16 ENCOUNTER — Other Ambulatory Visit: Payer: Self-pay | Admitting: Family Medicine

## 2019-01-16 DIAGNOSIS — E2839 Other primary ovarian failure: Secondary | ICD-10-CM

## 2019-02-07 DIAGNOSIS — R5383 Other fatigue: Secondary | ICD-10-CM | POA: Diagnosis not present

## 2019-03-09 DIAGNOSIS — E7210 Disorders of sulfur-bearing amino-acid metabolism, unspecified: Secondary | ICD-10-CM | POA: Diagnosis not present

## 2019-03-09 DIAGNOSIS — R5383 Other fatigue: Secondary | ICD-10-CM | POA: Diagnosis not present

## 2019-03-09 DIAGNOSIS — E559 Vitamin D deficiency, unspecified: Secondary | ICD-10-CM | POA: Diagnosis not present

## 2019-03-09 DIAGNOSIS — R7989 Other specified abnormal findings of blood chemistry: Secondary | ICD-10-CM | POA: Diagnosis not present

## 2019-03-15 DIAGNOSIS — R5383 Other fatigue: Secondary | ICD-10-CM | POA: Diagnosis not present

## 2019-06-12 ENCOUNTER — Other Ambulatory Visit: Payer: Self-pay

## 2019-06-12 ENCOUNTER — Ambulatory Visit
Admission: RE | Admit: 2019-06-12 | Discharge: 2019-06-12 | Disposition: A | Discharge status: Home / Self Care | Payer: PPO | Source: Ambulatory Visit | Attending: Family Medicine | Admitting: Family Medicine

## 2019-06-12 DIAGNOSIS — Z780 Asymptomatic menopausal state: Secondary | ICD-10-CM | POA: Diagnosis not present

## 2019-06-12 DIAGNOSIS — M8589 Other specified disorders of bone density and structure, multiple sites: Secondary | ICD-10-CM | POA: Diagnosis not present

## 2019-06-12 DIAGNOSIS — E2839 Other primary ovarian failure: Secondary | ICD-10-CM

## 2019-06-13 DIAGNOSIS — R5383 Other fatigue: Secondary | ICD-10-CM | POA: Diagnosis not present

## 2019-06-14 ENCOUNTER — Ambulatory Visit: Payer: PPO

## 2019-06-23 ENCOUNTER — Ambulatory Visit: Payer: PPO | Attending: Internal Medicine

## 2019-06-23 DIAGNOSIS — Z23 Encounter for immunization: Secondary | ICD-10-CM | POA: Insufficient documentation

## 2019-06-23 NOTE — Progress Notes (Signed)
   Covid-19 Vaccination Clinic  Name:  JAELLE FIERSTEIN    MRN: [id number] DOB: 1941/04/22  06/23/2019  Ms. Vandervort was observed post Covid-19 immunization for 15 minutes without incidence. She was provided with Vaccine Information Sheet and instruction to access the V-Safe system.   Ms. Abdul was instructed to call 911 with any severe reactions post vaccine: Marland Kitchen Difficulty breathing  . Swelling of your face and throat  . A fast heartbeat  . A bad rash all over your body  . Dizziness and weakness    Immunizations Administered    Name Date Dose VIS Date Route   Pfizer COVID-19 Vaccine 06/23/2019  2:53 PM 0.3 mL 04/28/2019 Intramuscular   Manufacturer: [place]   Lot: [id number]   [place]: [id number]

## 2019-07-18 ENCOUNTER — Ambulatory Visit: Payer: PPO | Attending: Internal Medicine

## 2019-07-18 DIAGNOSIS — Z23 Encounter for immunization: Secondary | ICD-10-CM

## 2019-07-18 DIAGNOSIS — R5383 Other fatigue: Secondary | ICD-10-CM | POA: Diagnosis not present

## 2019-07-18 NOTE — Progress Notes (Signed)
   Covid-19 Vaccination Clinic  Name:  Tammy Harrell    MRN: [id number] DOB: 10/25/1940  07/18/2019  Tammy Harrell was observed post Covid-19 immunization for 15 minutes without incident. She was provided with Vaccine Information Sheet and instruction to access the V-Safe system.   Tammy Harrell was instructed to call 911 with any severe reactions post vaccine: Marland Kitchen Difficulty breathing  . Swelling of face and throat  . A fast heartbeat  . A bad rash all over body  . Dizziness and weakness   Immunizations Administered    Name Date Dose VIS Date Route   Pfizer COVID-19 Vaccine 07/18/2019  2:45 PM 0.3 mL 04/28/2019 Intramuscular   Manufacturer: [place]   Lot: [id number]   [place]: [id number]

## 2019-08-01 DIAGNOSIS — R1310 Dysphagia, unspecified: Secondary | ICD-10-CM | POA: Diagnosis not present

## 2019-08-03 DIAGNOSIS — R1310 Dysphagia, unspecified: Secondary | ICD-10-CM | POA: Diagnosis not present

## 2019-08-14 DIAGNOSIS — E559 Vitamin D deficiency, unspecified: Secondary | ICD-10-CM | POA: Diagnosis not present

## 2019-08-14 DIAGNOSIS — R5383 Other fatigue: Secondary | ICD-10-CM | POA: Diagnosis not present

## 2019-08-14 DIAGNOSIS — I7090 Unspecified atherosclerosis: Secondary | ICD-10-CM | POA: Diagnosis not present

## 2019-08-14 DIAGNOSIS — E7210 Disorders of sulfur-bearing amino-acid metabolism, unspecified: Secondary | ICD-10-CM | POA: Diagnosis not present

## 2019-08-14 DIAGNOSIS — R7989 Other specified abnormal findings of blood chemistry: Secondary | ICD-10-CM | POA: Diagnosis not present

## 2019-08-22 DIAGNOSIS — R1310 Dysphagia, unspecified: Secondary | ICD-10-CM | POA: Diagnosis not present

## 2019-08-24 DIAGNOSIS — E785 Hyperlipidemia, unspecified: Secondary | ICD-10-CM | POA: Diagnosis not present

## 2019-08-24 DIAGNOSIS — R1310 Dysphagia, unspecified: Secondary | ICD-10-CM | POA: Diagnosis not present

## 2019-08-30 DIAGNOSIS — K449 Diaphragmatic hernia without obstruction or gangrene: Secondary | ICD-10-CM | POA: Diagnosis not present

## 2019-08-30 DIAGNOSIS — R1310 Dysphagia, unspecified: Secondary | ICD-10-CM | POA: Diagnosis not present

## 2019-08-30 DIAGNOSIS — R1110 Vomiting, unspecified: Secondary | ICD-10-CM | POA: Diagnosis not present

## 2019-08-31 DIAGNOSIS — R1310 Dysphagia, unspecified: Secondary | ICD-10-CM | POA: Diagnosis not present

## 2019-08-31 DIAGNOSIS — E785 Hyperlipidemia, unspecified: Secondary | ICD-10-CM | POA: Diagnosis not present

## 2019-09-14 DIAGNOSIS — R1310 Dysphagia, unspecified: Secondary | ICD-10-CM | POA: Diagnosis not present

## 2019-09-14 DIAGNOSIS — K2100 Gastro-esophageal reflux disease with esophagitis, without bleeding: Secondary | ICD-10-CM | POA: Diagnosis not present

## 2019-09-14 DIAGNOSIS — K222 Esophageal obstruction: Secondary | ICD-10-CM | POA: Diagnosis not present

## 2019-09-14 DIAGNOSIS — K219 Gastro-esophageal reflux disease without esophagitis: Secondary | ICD-10-CM | POA: Diagnosis not present

## 2019-09-17 DIAGNOSIS — K5900 Constipation, unspecified: Secondary | ICD-10-CM | POA: Diagnosis not present

## 2019-09-17 DIAGNOSIS — R1310 Dysphagia, unspecified: Secondary | ICD-10-CM | POA: Diagnosis not present

## 2019-09-18 ENCOUNTER — Other Ambulatory Visit: Payer: Self-pay | Admitting: Gastroenterology

## 2019-09-18 DIAGNOSIS — R1310 Dysphagia, unspecified: Secondary | ICD-10-CM | POA: Diagnosis not present

## 2019-09-18 DIAGNOSIS — K5900 Constipation, unspecified: Secondary | ICD-10-CM | POA: Diagnosis not present

## 2019-09-28 DIAGNOSIS — H52203 Unspecified astigmatism, bilateral: Secondary | ICD-10-CM | POA: Diagnosis not present

## 2019-09-28 DIAGNOSIS — Z961 Presence of intraocular lens: Secondary | ICD-10-CM | POA: Diagnosis not present

## 2019-10-10 ENCOUNTER — Other Ambulatory Visit (HOSPITAL_COMMUNITY)
Admission: RE | Admit: 2019-10-10 | Discharge: 2019-10-10 | Disposition: A | Discharge status: Home / Self Care | Payer: PPO | Source: Ambulatory Visit | Attending: Gastroenterology | Admitting: Gastroenterology

## 2019-10-10 DIAGNOSIS — Z20822 Contact with and (suspected) exposure to covid-19: Secondary | ICD-10-CM | POA: Insufficient documentation

## 2019-10-10 DIAGNOSIS — Z01812 Encounter for preprocedural laboratory examination: Secondary | ICD-10-CM | POA: Insufficient documentation

## 2019-10-10 LAB — SARS CORONAVIRUS 2 (TAT 6-24 HRS): SARS Coronavirus 2: NEGATIVE

## 2019-10-12 NOTE — Progress Notes (Signed)
Pre-op call made for endo procedure tomorrow. Patient states they have been quarantined since Covid test and has no symptoms of being ill. Patient confirmed they will have a driver home. All questions addressed.

## 2019-10-13 ENCOUNTER — Other Ambulatory Visit: Payer: Self-pay

## 2019-10-13 ENCOUNTER — Encounter (HOSPITAL_COMMUNITY): Payer: Self-pay | Admitting: Gastroenterology

## 2019-10-13 ENCOUNTER — Ambulatory Visit (HOSPITAL_COMMUNITY)
Admission: RE | Admit: 2019-10-13 | Discharge: 2019-10-13 | Disposition: A | Discharge status: Home / Self Care | Payer: PPO | Attending: Gastroenterology | Admitting: Gastroenterology

## 2019-10-13 ENCOUNTER — Ambulatory Visit (HOSPITAL_COMMUNITY): Payer: PPO | Admitting: Anesthesiology

## 2019-10-13 ENCOUNTER — Encounter (HOSPITAL_COMMUNITY)
Admission: RE | Disposition: A | Discharge status: Home / Self Care | Payer: Self-pay | Source: Home / Self Care | Attending: Gastroenterology

## 2019-10-13 DIAGNOSIS — M8580 Other specified disorders of bone density and structure, unspecified site: Secondary | ICD-10-CM | POA: Diagnosis not present

## 2019-10-13 DIAGNOSIS — K2270 Barrett's esophagus without dysplasia: Secondary | ICD-10-CM | POA: Diagnosis not present

## 2019-10-13 DIAGNOSIS — K219 Gastro-esophageal reflux disease without esophagitis: Secondary | ICD-10-CM | POA: Diagnosis not present

## 2019-10-13 DIAGNOSIS — K449 Diaphragmatic hernia without obstruction or gangrene: Secondary | ICD-10-CM | POA: Diagnosis not present

## 2019-10-13 DIAGNOSIS — R1310 Dysphagia, unspecified: Secondary | ICD-10-CM | POA: Diagnosis not present

## 2019-10-13 DIAGNOSIS — Z885 Allergy status to narcotic agent status: Secondary | ICD-10-CM | POA: Diagnosis not present

## 2019-10-13 HISTORY — PX: BIOPSY: SHX5522

## 2019-10-13 HISTORY — DX: Family history of other specified conditions: Z84.89

## 2019-10-13 HISTORY — PX: ESOPHAGOGASTRODUODENOSCOPY (EGD) WITH PROPOFOL: SHX5813

## 2019-10-13 HISTORY — PX: SAVORY DILATION: SHX5439

## 2019-10-13 SURGERY — ESOPHAGOGASTRODUODENOSCOPY (EGD) WITH PROPOFOL
Anesthesia: Monitor Anesthesia Care

## 2019-10-13 MED ORDER — LACTATED RINGERS IV SOLN
INTRAVENOUS | Status: DC
  Administered 2019-10-13: 1000 mL via INTRAVENOUS

## 2019-10-13 MED ORDER — PROPOFOL 10 MG/ML IV BOLUS
INTRAVENOUS | Status: DC | PRN
  Administered 2019-10-13 (×3): 20 mg via INTRAVENOUS
  Administered 2019-10-13: 40 mg via INTRAVENOUS
  Administered 2019-10-13 (×2): 20 mg via INTRAVENOUS
  Administered 2019-10-13: 40 mg via INTRAVENOUS
  Administered 2019-10-13 (×4): 20 mg via INTRAVENOUS

## 2019-10-13 MED ORDER — LIDOCAINE 2% (20 MG/ML) 5 ML SYRINGE
INTRAMUSCULAR | Status: DC | PRN
  Administered 2019-10-13: 40 mg via INTRAVENOUS

## 2019-10-13 MED ORDER — SODIUM CHLORIDE 0.9 % IV SOLN: INTRAVENOUS | Status: DC

## 2019-10-13 SURGICAL SUPPLY — 15 items

## 2019-10-13 NOTE — Anesthesia Procedure Notes (Signed)
Procedure Name: MAC Date/Time: 10/13/2019 10:49 AM Performed by: Cynda Familia, CRNA Pre-anesthesia Checklist: Patient identified, Emergency Drugs available, Suction available, Patient being monitored and Timeout performed Patient Re-evaluated:Patient Re-evaluated prior to induction Oxygen Delivery Method: Simple face mask Placement Confirmation: positive ETCO2 and breath sounds checked- equal and bilateral Dental Injury: Teeth and Oropharynx as per pre-operative assessment  Comments: Bite block by rn

## 2019-10-13 NOTE — Discharge Instructions (Signed)
YOU HAD AN ENDOSCOPIC PROCEDURE TODAY: Refer to the procedure report and other information in the discharge instructions given to you for any specific questions about what was found during the examination. If this information does not answer your questions, please call [facility] at [phone] to clarify.   YOU SHOULD EXPECT: Some feelings of bloating in the abdomen. Passage of more gas than usual. Walking can help get rid of the air that was put into your GI tract during the procedure and reduce the bloating. If you had a lower endoscopy (such as a colonoscopy or flexible sigmoidoscopy) you may notice spotting of blood in your stool or on the toilet paper. Some abdominal soreness may be present for a day or two, also.  DIET: Your first meal following the procedure should be a light meal and then it is ok to progress to your normal diet. A half-sandwich or bowl of soup is an example of a good first meal. Heavy or fried foods are harder to digest and may make you feel nauseous or bloated. Drink plenty of fluids but you should avoid alcoholic beverages for 24 hours.   ACTIVITY: Your care partner should take you home directly after the procedure. You should plan to take it easy, moving slowly for the rest of the day. You can resume normal activity the day after the procedure however YOU SHOULD NOT DRIVE, use power tools, machinery or perform tasks that involve climbing or major physical exertion for 24 hours (because of the sedation medicines used during the test).   SYMPTOMS TO REPORT IMMEDIATELY: A gastroenterologist can be reached at any hour. Please call [phone]  for any of the following symptoms:    Following upper endoscopy (EGD, EUS, ERCP, esophageal dilation) Vomiting of blood or coffee ground material  New, significant abdominal pain  New, significant chest pain or pain under the shoulder blades  Painful or persistently difficult swallowing  New shortness of breath  Black,  tarry-looking or red, bloody stools  FOLLOW UP:  If any biopsies were taken you will be contacted by phone or by letter within the next 1-3 weeks. Call [phone]  if you have not heard about the biopsies in 3 weeks.  Please also call with any specific questions about appointments or follow up tests.  

## 2019-10-13 NOTE — Transfer of Care (Signed)
Immediate Anesthesia Transfer of Care Note  Patient: Tammy Harrell  Procedure(s) Performed: ESOPHAGOGASTRODUODENOSCOPY (EGD) WITH PROPOFOL (N/A ) BIOPSY SAVORY DILATION (N/A )  Patient Location: PACU and Endoscopy Unit  Anesthesia Type:MAC  Level of Consciousness: awake  Airway & Oxygen Therapy: Patient Spontanous Breathing and Patient connected to face mask oxygen  Post-op Assessment: Report given to RN and Post -op Vital signs reviewed and stable  Post vital signs: Reviewed and stable  Last Vitals:  Vitals Value Taken Time  BP    Temp    Pulse    Resp    SpO2      Last Pain:  Vitals:   10/13/19 0926  TempSrc: Oral  PainSc: 0-No pain         Complications: No apparent anesthesia complications

## 2019-10-13 NOTE — Anesthesia Preprocedure Evaluation (Addendum)
Anesthesia Evaluation  Patient identified by MRN, date of birth, ID band Patient awake    Reviewed: Allergy & Precautions, NPO status , Patient's Chart, lab work & pertinent test results  History of Anesthesia Complications Negative for: history of anesthetic complications  Airway Mallampati: II  TM Distance: >3 FB Neck ROM: Full    Dental   Pulmonary neg pulmonary ROS,    Pulmonary exam normal        Cardiovascular negative cardio ROS Normal cardiovascular exam     Neuro/Psych negative neurological ROS  negative psych ROS   GI/Hepatic Neg liver ROS, GERD  ,dysphagia   Endo/Other  negative endocrine ROS  Renal/GU negative Renal ROS  negative genitourinary   Musculoskeletal negative musculoskeletal ROS (+)   Abdominal   Peds  Hematology negative hematology ROS (+)   Anesthesia Other Findings   Reproductive/Obstetrics                            Anesthesia Physical Anesthesia Plan  ASA: II  Anesthesia Plan: MAC   Post-op Pain Management:    Induction: Intravenous  PONV Risk Score and Plan: 2 and Propofol infusion, TIVA and Treatment may vary due to age or medical condition  Airway Management Planned: Natural Airway, Nasal Cannula and Simple Face Mask  Additional Equipment: None  Intra-op Plan:   Post-operative Plan:   Informed Consent: I have reviewed the patients History and Physical, chart, labs and discussed the procedure including the risks, benefits and alternatives for the proposed anesthesia with the patient or authorized representative who has indicated his/her understanding and acceptance.       Plan Discussed with:   Anesthesia Plan Comments: (  Pt had EGD 4 weeks ago and reports she had a "spasm" and that is why this procedure was scheduled here instead of at the office. She was found to have severe GERD at that time and was started on Nexium which she has been  taking for the last 4 weeks with improvement in her symptoms. I discussed that we would still plan for MAC today, but have GA as a backup in case of laryngospasm. )       Anesthesia Quick Evaluation

## 2019-10-13 NOTE — Op Note (Signed)
[facility] Patient Name: Tammy Harrell Procedure Date: 10/13/2019 MRN: [id number] Attending MD: Carol Ada , MD Date of Birth: 07/13/1940 CSN: [id number] Age: 79 Admit Type: Outpatient Procedure:                Upper GI endoscopy Indications:              Dysphagia Providers:                Carol Ada, MD, Cleda Daub, RN, Cherylynn Ridges,                            Technician, Glenis Smoker, CRNA Referring MD:              Medicines:                 Complications:            No immediate complications. Estimated Blood Loss:     Estimated blood loss was minimal. Procedure:                Pre-Anesthesia Assessment:                           - Prior to the procedure, a History and Physical                            was performed, and patient medications and                            allergies were reviewed. The patient's tolerance of                            previous anesthesia was also reviewed. The risks                            and benefits of the procedure and the sedation                            options and risks were discussed with the patient.                            All questions were answered, and informed consent                            was obtained. Prior Anticoagulants: The patient has                            taken no previous anticoagulant or antiplatelet                            agents. ASA Grade Assessment: III - A patient with                            severe systemic disease. After reviewing the risks  and benefits, the patient was deemed in                            satisfactory condition to undergo the procedure.                           - Sedation was administered by an anesthesia                            professional. Deep sedation was attained.                           After obtaining informed consent, the endoscope was                            passed under direct vision. Throughout the                            procedure, the patient's blood pressure, pulse, and                            oxygen saturations were monitored continuously. The                            [id number]) was introduced through the                            mouth, and advanced to the second part of duodenum.                            The upper GI endoscopy was accomplished without                            difficulty. The patient tolerated the procedure                            well. Scope In: Scope Out: Findings:      No endoscopic abnormality was evident in the esophagus to explain the       patient's complaint of dysphagia. It was decided, however, to proceed       with dilation of the entire esophagus. A guidewire was placed and the       scope was withdrawn. Dilation was performed with a Savary dilator with       no resistance at 18 mm.      Circumferential salmon-colored mucosa was present from 28 to 33 cm. The       maximum longitudinal extent of these esophageal mucosal changes was 6 cm       in length. Biopsies were taken with a cold forceps for histology. Mucosa       was biopsied with a cold forceps for histology in 4 quadrants at       intervals of 2.5 cm. A total of 2 specimen bottles were sent to       pathology.      A 7 cm hiatal hernia was present.      The stomach was  normal.      The examined duodenum was normal. Impression:               - No endoscopic esophageal abnormality to explain                            patient's dysphagia. Esophagus dilated. Dilated.                           - Salmon-colored mucosa classified as Barrett's                            stage C5-M6 per Prague criteria. Biopsied.                           - 7 cm hiatal hernia.                           - Normal stomach.                           - Normal examined duodenum. Moderate Sedation:      Not Applicable - Patient had care per Anesthesia. Recommendation:           - Patient has a  contact number available for                            emergencies. The signs and symptoms of potential                            delayed complications were discussed with the                            patient. Return to normal activities tomorrow.                            Written discharge instructions were provided to the                            patient.                           - Resume previous diet.                           - Continue present medications.                           - Await pathology results.                           - Repeat upper endoscopy in 3 years for                            surveillance.                           - Upper GI series. Procedure  Code(s):        --- Professional ---                           [phone], Esophagogastroduodenoscopy, flexible,                            transoral; with insertion of guide wire followed by                            passage of dilator(s) through esophagus over guide                            wire                           43239, 17, Esophagogastroduodenoscopy, flexible,                            transoral; with biopsy, single or multiple Diagnosis Code(s):        --- Professional ---                           R13.10, Dysphagia, unspecified                           K22.70, Barrett's esophagus without dysplasia                           K44.9, Diaphragmatic hernia without obstruction or                            gangrene CPT copyright 2019 American Medical Association. All rights reserved. The codes documented in this report are preliminary and upon coder review may  be revised to meet current compliance requirements. Carol Ada, MD Carol Ada, MD 10/13/2019 11:15:24 AM This report has been signed electronically. Number of Addenda: 0

## 2019-10-13 NOTE — Anesthesia Postprocedure Evaluation (Signed)
Anesthesia Post Note  Patient: Tammy Harrell  Procedure(s) Performed: ESOPHAGOGASTRODUODENOSCOPY (EGD) WITH PROPOFOL (N/A ) BIOPSY SAVORY DILATION (N/A )     Patient location during evaluation: Endoscopy Anesthesia Type: MAC Level of consciousness: awake and alert Pain management: pain level controlled Vital Signs Assessment: post-procedure vital signs reviewed and stable Respiratory status: spontaneous breathing, nonlabored ventilation and respiratory function stable Cardiovascular status: blood pressure returned to baseline and stable Postop Assessment: no apparent nausea or vomiting Anesthetic complications: no    Last Vitals:  Vitals:   10/13/19 1120 10/13/19 1130  BP: (!) 136/39 (!) 146/46  Pulse: (!) 57 (!) 59  Resp: 12 16  Temp:    SpO2: 100% 97%    Last Pain:  Vitals:   10/13/19 1118  TempSrc: Oral  PainSc: 0-No pain                 Lidia Collum

## 2019-10-13 NOTE — H&P (Signed)
  Tammy Harrell HPI: In December 2020 she started to notice issues with swallowing pills. There was an odynophagia component and she started to take her pills one at a time. In March she cut one of her pills in half and she felt that it stuck in her esophagus. She was not able to clear the obstruction initially, but it ultimately did pass. The patient also notices a solid food dysphagia. She needs to drink water behind the food bolus to help pass the food. Her symptoms can improve when she "burps". The patient denies any weight loss as a result of her symptoms.    Past Medical History:  Diagnosis Date  . Cataract   . GERD (gastroesophageal reflux disease)   . Osteopenia     Past Surgical History:  Procedure Laterality Date  . CATARACT EXTRACTION    . EYE SURGERY    . TUBAL LIGATION      No family history on file.  Social History:  reports that she has never smoked. She does not have any smokeless tobacco history on file. She reports that she does not drink alcohol or use drugs.  Allergies:  Allergies  Allergen Reactions  . Codeine Nausea Only    Medications: Scheduled: Continuous:  No results found for this or any previous visit (from the past 24 hour(s)).   No results found.  ROS:  As stated above in the HPI otherwise negative.  Height 5' 6.5" (1.689 m), weight 83 kg.    PE: Gen: NAD, Alert and Oriented HEENT:  [place]/AT, EOMI Neck: Supple, no LAD Lungs: CTA Bilaterally CV: RRR without M/G/R ABD: Soft, NTND, +BS Ext: No C/C/E  Assessment/Plan: 1) Dysphagia - EGD with dilation.  Kinzy Weyers D 10/13/2019, 8:23 AM

## 2019-10-17 ENCOUNTER — Encounter: Payer: Self-pay | Admitting: *Deleted

## 2019-10-17 DIAGNOSIS — R1310 Dysphagia, unspecified: Secondary | ICD-10-CM | POA: Diagnosis not present

## 2019-10-17 LAB — SURGICAL PATHOLOGY

## 2019-11-03 ENCOUNTER — Other Ambulatory Visit: Payer: Self-pay

## 2019-11-03 ENCOUNTER — Ambulatory Visit (INDEPENDENT_AMBULATORY_CARE_PROVIDER_SITE_OTHER): Payer: PPO

## 2019-11-03 ENCOUNTER — Ambulatory Visit: Payer: PPO | Admitting: Podiatry

## 2019-11-03 ENCOUNTER — Other Ambulatory Visit: Payer: Self-pay | Admitting: Podiatry

## 2019-11-03 ENCOUNTER — Encounter: Payer: Self-pay | Admitting: Podiatry

## 2019-11-03 DIAGNOSIS — M779 Enthesopathy, unspecified: Secondary | ICD-10-CM

## 2019-11-03 DIAGNOSIS — M79672 Pain in left foot: Secondary | ICD-10-CM | POA: Diagnosis not present

## 2019-11-05 NOTE — Progress Notes (Signed)
Subjective:   Patient ID: Tammy Harrell, female   DOB: 79 y.o.   MRN: [id number]   HPI Patient presents stating she has been having a lot of pain in her left foot and states that she has had problems before but this is been worse.  Patient has orthotics that are over 42 years old and needs a new pair and needs to offload the forefoot on both feet which is been beneficial in the past.  Patient does not smoke likes to be active   Review of Systems  All other systems reviewed and are negative.       Objective:  Physical Exam Vitals and nursing note reviewed.  Constitutional:      Appearance: She is well-developed.  Pulmonary:     Effort: Pulmonary effort is normal.  Musculoskeletal:        General: Normal range of motion.  Skin:    General: Skin is warm.  Neurological:     Mental Status: She is alert.     Neurovascular status intact muscle strength found to be within normal limits range of motion adequate.  Patient does have exquisite forefoot discomfort with most the pain around the third metatarsal phalangeal joint left with fluid buildup and is noted to have no keratotic tissue currently at this time.  Patient has good digital perfusion well oriented x3     Assessment:  Probability for acute capsulitis left with inflammation fluid buildup around the metatarsal phalangeal joint that is painful when pressed and making shoe gear difficult     Plan:  H&P reviewed condition and x-ray.  We discussed treatment options and at this point due to the acuteness of discomfort I did a forefoot block aspirated the joint getting out a small amount of clear fluid injected quarter cc dexamethasone Kenalog and thick plantar padding.  I then reviewed orthotics and I educated her on what we would try to accomplish and she wants them done.  She does need very soft orthotics and I am referring her to ped orthotist for these along with probability for metatarsal padding to reduce pressure on the joint  surface

## 2019-11-06 DIAGNOSIS — K219 Gastro-esophageal reflux disease without esophagitis: Secondary | ICD-10-CM | POA: Diagnosis not present

## 2019-11-06 DIAGNOSIS — K222 Esophageal obstruction: Secondary | ICD-10-CM | POA: Diagnosis not present

## 2019-11-06 DIAGNOSIS — R1310 Dysphagia, unspecified: Secondary | ICD-10-CM | POA: Diagnosis not present

## 2019-11-14 ENCOUNTER — Ambulatory Visit (INDEPENDENT_AMBULATORY_CARE_PROVIDER_SITE_OTHER): Payer: PPO | Admitting: Orthotics

## 2019-11-14 ENCOUNTER — Other Ambulatory Visit: Payer: Self-pay

## 2019-11-14 DIAGNOSIS — M779 Enthesopathy, unspecified: Secondary | ICD-10-CM | POA: Diagnosis not present

## 2019-11-14 DIAGNOSIS — M79672 Pain in left foot: Secondary | ICD-10-CM

## 2019-11-14 NOTE — Progress Notes (Signed)
Patient cast today for f/o to address forefoot pain/3 capsuplistis b/l.  Plan on semi-rigid device with ff offload 3 and met pads b/l  Dawn will get pre-auth HTA

## 2019-11-24 ENCOUNTER — Encounter: Payer: Self-pay | Admitting: Podiatry

## 2019-11-24 ENCOUNTER — Ambulatory Visit: Payer: PPO | Admitting: Podiatry

## 2019-11-24 ENCOUNTER — Other Ambulatory Visit: Payer: Self-pay

## 2019-11-24 DIAGNOSIS — M778 Other enthesopathies, not elsewhere classified: Secondary | ICD-10-CM | POA: Diagnosis not present

## 2019-11-24 DIAGNOSIS — M779 Enthesopathy, unspecified: Secondary | ICD-10-CM

## 2019-11-27 NOTE — Progress Notes (Signed)
Subjective:   Patient ID: Tammy Harrell, female   DOB: 79 y.o.   MRN: [id number]   HPI Patient states she is feeling quite a bit better but still having mild discomfort if she is on her foot for too long   ROS      Objective:  Physical Exam  Neurovascular status intact with patient found to have diminished discomfort left hallux second digit with mild pain only upon extensive ambulation     Assessment:  Doing better from capsulitis-like symptomatology left     Plan:  Reviewed continued boot usage and utilization of padding therapy along with rigid bottom shoes.  Patient is discharged and was encouraged to use topical medicines oral medicines as needed and will be seen back by me if symptoms were to become bothersome

## 2019-12-05 ENCOUNTER — Other Ambulatory Visit: Payer: Self-pay

## 2019-12-05 ENCOUNTER — Ambulatory Visit: Payer: PPO | Admitting: Orthotics

## 2019-12-05 DIAGNOSIS — M779 Enthesopathy, unspecified: Secondary | ICD-10-CM

## 2019-12-05 DIAGNOSIS — M79672 Pain in left foot: Secondary | ICD-10-CM

## 2019-12-05 NOTE — Progress Notes (Signed)
Patient came in today to pick up custom made foot orthotics.  The goals were accomplished and the patient reported no dissatisfaction with said orthotics.  Patient was advised of breakin period and how to report any issues. 

## 2019-12-07 DIAGNOSIS — R1310 Dysphagia, unspecified: Secondary | ICD-10-CM | POA: Diagnosis not present

## 2019-12-11 DIAGNOSIS — E041 Nontoxic single thyroid nodule: Secondary | ICD-10-CM | POA: Diagnosis not present

## 2019-12-11 DIAGNOSIS — M8580 Other specified disorders of bone density and structure, unspecified site: Secondary | ICD-10-CM | POA: Diagnosis not present

## 2019-12-11 DIAGNOSIS — Z0000 Encounter for general adult medical examination without abnormal findings: Secondary | ICD-10-CM | POA: Diagnosis not present

## 2019-12-11 DIAGNOSIS — E7800 Pure hypercholesterolemia, unspecified: Secondary | ICD-10-CM | POA: Diagnosis not present

## 2019-12-11 DIAGNOSIS — R7301 Impaired fasting glucose: Secondary | ICD-10-CM | POA: Diagnosis not present

## 2019-12-25 ENCOUNTER — Other Ambulatory Visit: Payer: PPO | Admitting: Orthotics

## 2019-12-25 ENCOUNTER — Ambulatory Visit: Payer: PPO | Admitting: Podiatry

## 2020-01-04 ENCOUNTER — Ambulatory Visit: Payer: PPO | Admitting: Podiatry

## 2020-01-04 ENCOUNTER — Encounter: Payer: Self-pay | Admitting: Podiatry

## 2020-01-04 ENCOUNTER — Ambulatory Visit: Payer: PPO | Admitting: Orthotics

## 2020-01-04 ENCOUNTER — Other Ambulatory Visit: Payer: Self-pay

## 2020-01-04 VITALS — Temp 97.8°F

## 2020-01-04 DIAGNOSIS — M779 Enthesopathy, unspecified: Secondary | ICD-10-CM

## 2020-01-04 DIAGNOSIS — L84 Corns and callosities: Secondary | ICD-10-CM | POA: Diagnosis not present

## 2020-01-04 NOTE — Progress Notes (Signed)
Offload 2 b/l; change topcover to pcell full lenth; NO met pads.

## 2020-01-05 NOTE — Progress Notes (Signed)
Subjective:   Patient ID: Tammy Harrell, female   DOB: 79 y.o.   MRN: [id number]   HPI Patient presents stating that the pain seems to have moved and they are trying to work on orthotics but it is very painful in this area and also she has 2 separate lesions that are painful left   ROS      Objective:  Physical Exam  Neurovascular status intact with exquisite discomfort of the left second MPJ and keratotic lesion with previous history of treatment third MPJ which is done great for her     Assessment:  Inflammatory capsulitis second MPJ left with corn callus formation x2 left     Plan:  H&P and went ahead today and did sterile block of the left forefoot.  Aspirated the joint getting out a small amount of clear fluid and injected quarter cc of dexamethasone and then debrided both lesions and advised on rigid bottom shoes and continuing to work on shoe gear modifications and orthotic modifications.  Reappoint as needed

## 2020-01-09 DIAGNOSIS — R5383 Other fatigue: Secondary | ICD-10-CM | POA: Diagnosis not present

## 2020-02-12 DIAGNOSIS — R1310 Dysphagia, unspecified: Secondary | ICD-10-CM | POA: Diagnosis not present

## 2020-03-13 DIAGNOSIS — R1310 Dysphagia, unspecified: Secondary | ICD-10-CM | POA: Diagnosis not present

## 2020-04-23 DIAGNOSIS — R5383 Other fatigue: Secondary | ICD-10-CM | POA: Diagnosis not present

## 2020-06-04 DIAGNOSIS — R5383 Other fatigue: Secondary | ICD-10-CM | POA: Diagnosis not present

## 2020-07-01 DIAGNOSIS — E785 Hyperlipidemia, unspecified: Secondary | ICD-10-CM | POA: Diagnosis not present

## 2020-07-01 DIAGNOSIS — R7989 Other specified abnormal findings of blood chemistry: Secondary | ICD-10-CM | POA: Diagnosis not present

## 2020-07-01 DIAGNOSIS — E782 Mixed hyperlipidemia: Secondary | ICD-10-CM | POA: Diagnosis not present

## 2020-07-01 DIAGNOSIS — E559 Vitamin D deficiency, unspecified: Secondary | ICD-10-CM | POA: Diagnosis not present

## 2020-07-01 DIAGNOSIS — I7090 Unspecified atherosclerosis: Secondary | ICD-10-CM | POA: Diagnosis not present

## 2020-07-09 DIAGNOSIS — R5383 Other fatigue: Secondary | ICD-10-CM | POA: Diagnosis not present

## 2020-07-30 DIAGNOSIS — R5383 Other fatigue: Secondary | ICD-10-CM | POA: Diagnosis not present

## 2020-08-28 DIAGNOSIS — R5383 Other fatigue: Secondary | ICD-10-CM | POA: Diagnosis not present

## 2020-09-30 DIAGNOSIS — H52203 Unspecified astigmatism, bilateral: Secondary | ICD-10-CM | POA: Diagnosis not present

## 2020-09-30 DIAGNOSIS — Z961 Presence of intraocular lens: Secondary | ICD-10-CM | POA: Diagnosis not present

## 2020-09-30 IMAGING — US US THYROID
1 series · 13 of 25 positions shown · non-contrast
Comparison: 11/23/2017, 12/01/2011

CLINICAL DATA: Right nodule. Previous FNA biopsy of right nodule
12/09/2011.

EXAM:
THYROID ULTRASOUND
TECHNIQUE: Ultrasound examination of the thyroid gland and adjacent soft
tissues was performed.

[Series 1: us thyroid · 0.06mm/px · 13 of 60 slices shown]
[im 1/60]
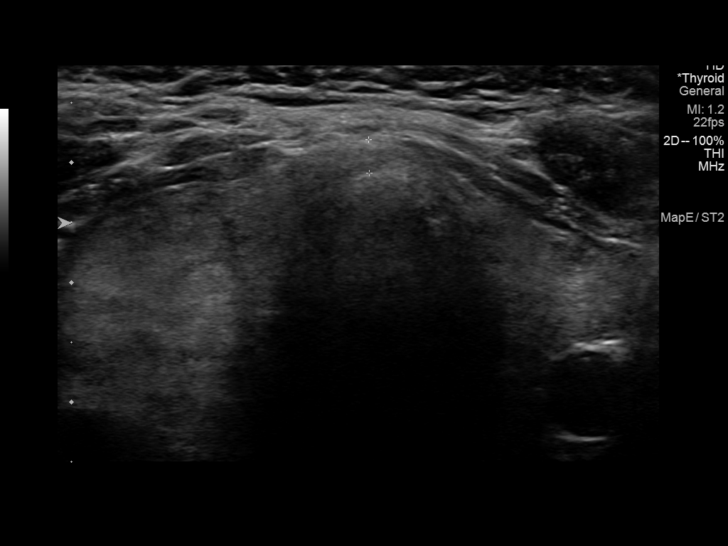
[im 5/60]
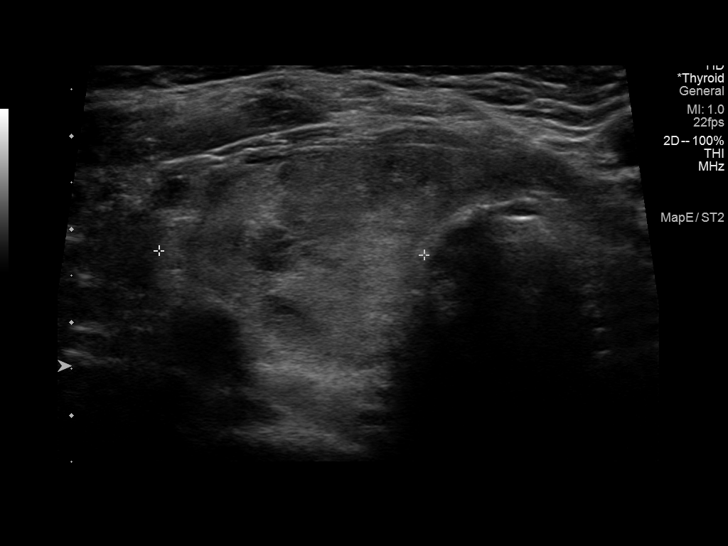
[im 10/60]
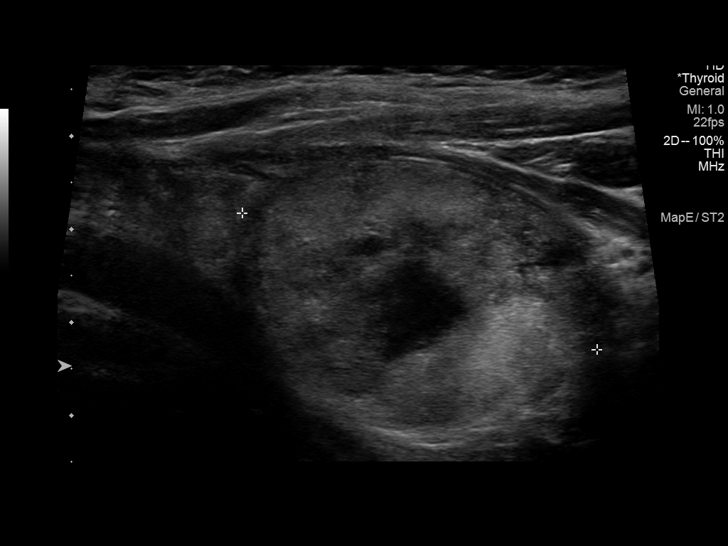
[im 15/60]
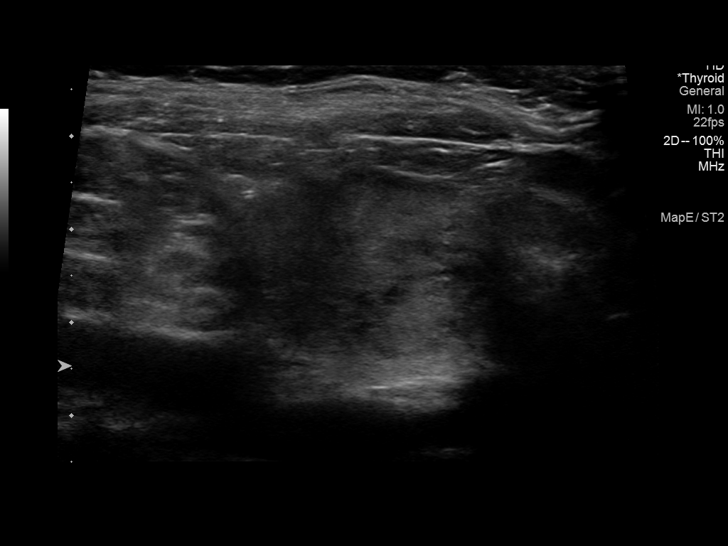
[im 20/60]
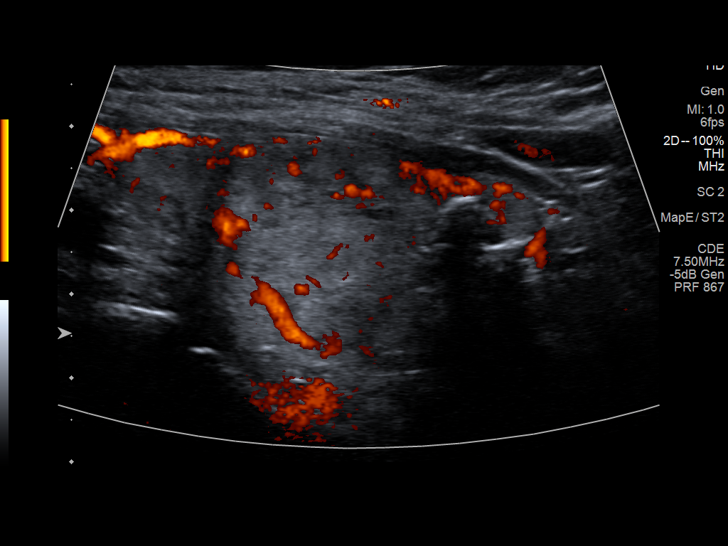
[im 25/60]
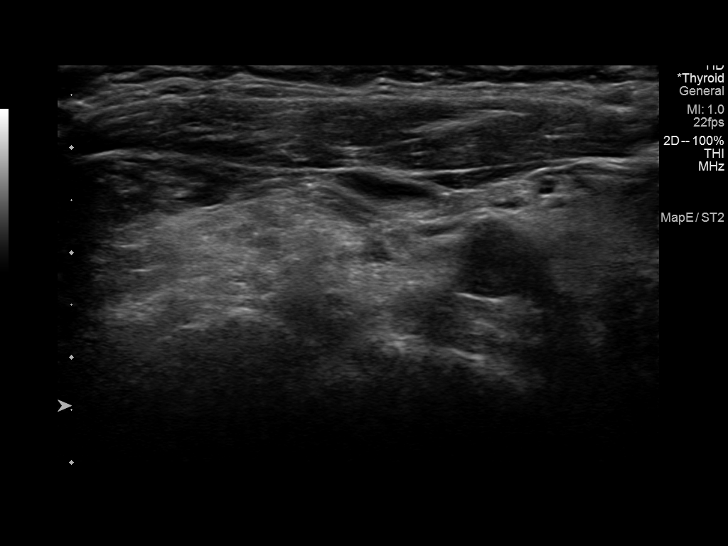
[im 30/60]
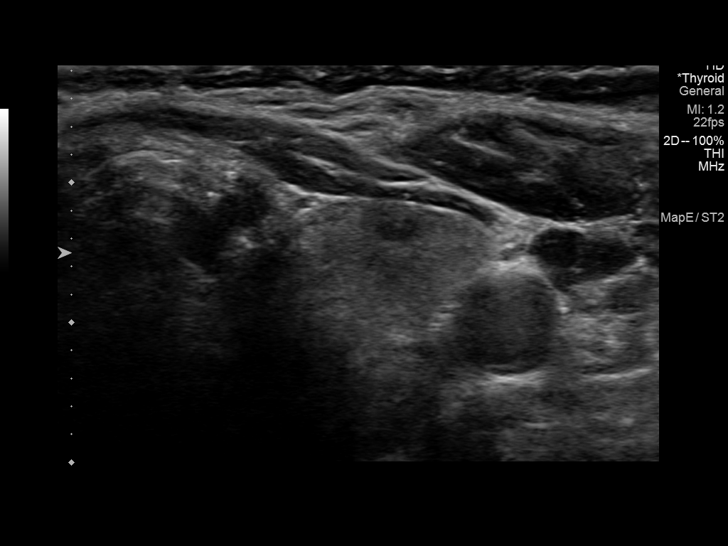
[im 35/60]
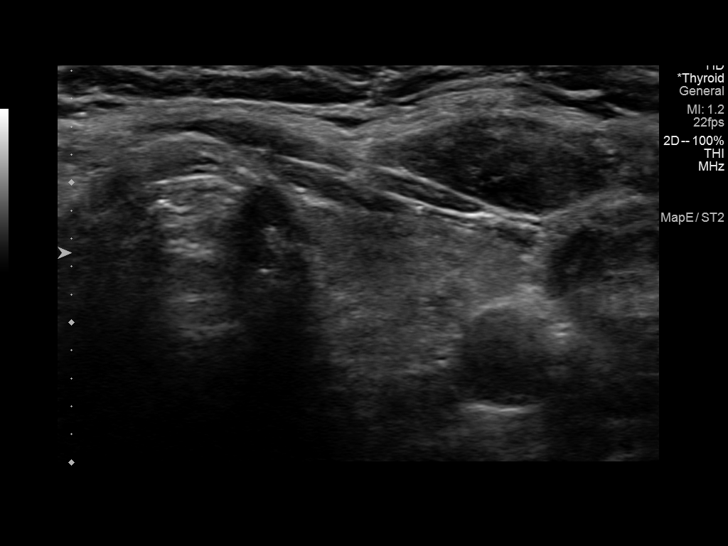
[im 40/60]
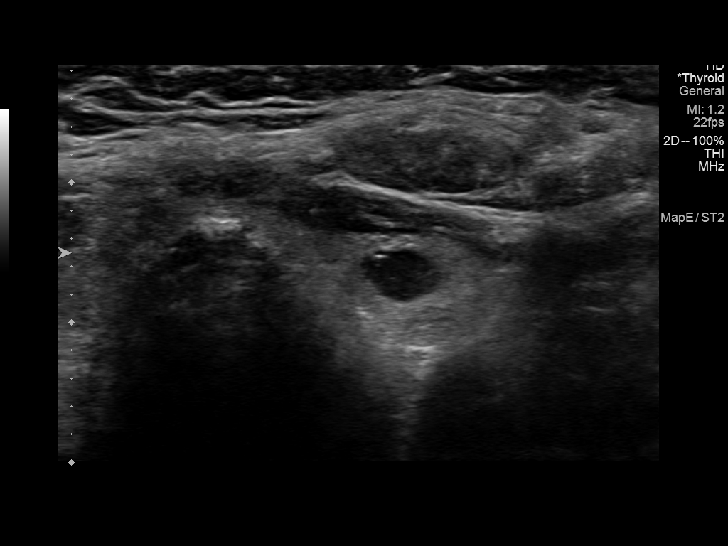
[im 45/60]
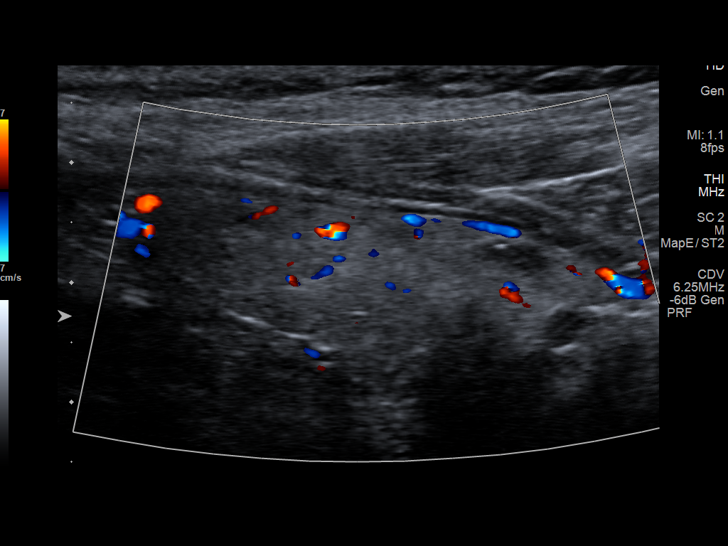
[im 50/60]
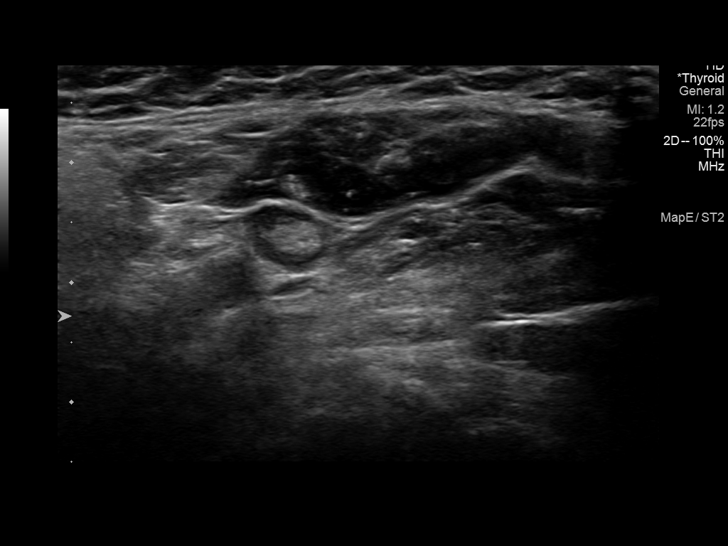
[im 55/60]
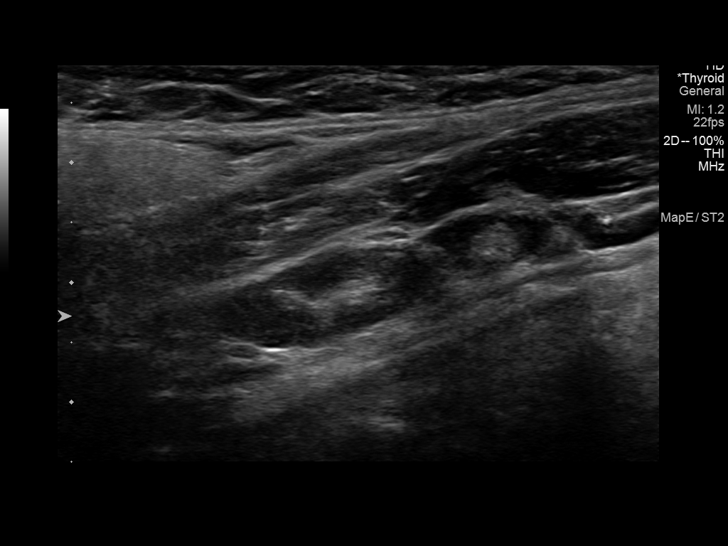
[im 60/60]
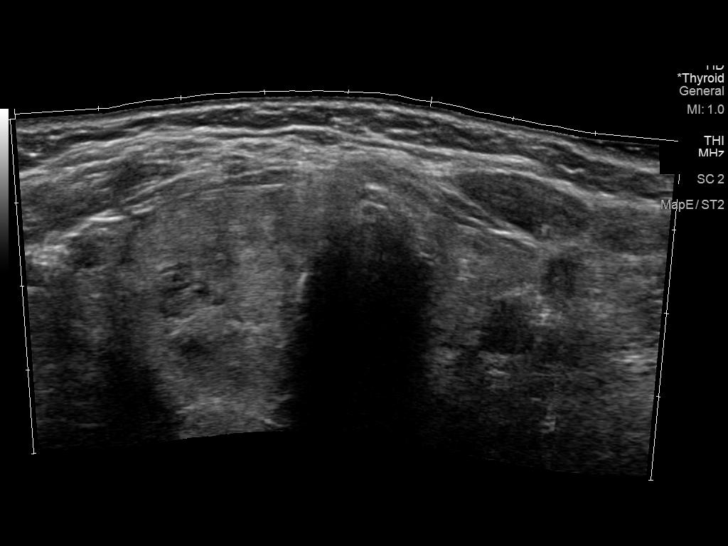

[13 of 25 positions shown; findings below may reference images not displayed]

FINDINGS: Parenchymal Echotexture: Moderately heterogenous

Isthmus: 0.3 cm thickness, previously

Right lobe: 5.9 x 3.1 x 2.9 cm, previously 5.2 x 2.5 x

Left lobe: 4.3 x 1.5 x 1.6 cm, previously 4.3 x 1.7 x

_________________________________________________________

Estimated total number of nodules >/= 1 cm: 1

Number of spongiform nodules >/=  2 cm not described below (TR1): 0

Number of mixed cystic and solid nodules >/= 1.5 cm not described
below (TR2): 0

_________________________________________________________

4.1 x 2.9 x 3.3 cm mostly solid mid right nodule, previously 4.1 x
2.8 x 4 cm; this was previously biopsied

0.9 cm hypoechoic inferior left nodule without calcifications,
previously 0.8; This nodule does NOT meet TI-RADS criteria for
biopsy or dedicated follow-up.
IMPRESSION: Stable thyromegaly with bilateral nodules. Neither currently meets
criteria for biopsy or dedicated imaging follow-up.

The above is in keeping with the ACR TI-RADS recommendations - [HOSPITAL] 9486;[DATE].

## 2020-10-02 DIAGNOSIS — R5383 Other fatigue: Secondary | ICD-10-CM | POA: Diagnosis not present

## 2020-11-12 DIAGNOSIS — R5383 Other fatigue: Secondary | ICD-10-CM | POA: Diagnosis not present

## 2020-12-10 DIAGNOSIS — R5383 Other fatigue: Secondary | ICD-10-CM | POA: Diagnosis not present

## 2020-12-20 DIAGNOSIS — Z23 Encounter for immunization: Secondary | ICD-10-CM | POA: Diagnosis not present

## 2020-12-20 DIAGNOSIS — R7301 Impaired fasting glucose: Secondary | ICD-10-CM | POA: Diagnosis not present

## 2020-12-20 DIAGNOSIS — Z0000 Encounter for general adult medical examination without abnormal findings: Secondary | ICD-10-CM | POA: Diagnosis not present

## 2020-12-20 DIAGNOSIS — E7800 Pure hypercholesterolemia, unspecified: Secondary | ICD-10-CM | POA: Diagnosis not present

## 2020-12-24 ENCOUNTER — Other Ambulatory Visit: Payer: Self-pay | Admitting: Family Medicine

## 2020-12-24 DIAGNOSIS — M8580 Other specified disorders of bone density and structure, unspecified site: Secondary | ICD-10-CM

## 2021-01-08 DIAGNOSIS — E559 Vitamin D deficiency, unspecified: Secondary | ICD-10-CM | POA: Diagnosis not present

## 2021-01-08 DIAGNOSIS — E782 Mixed hyperlipidemia: Secondary | ICD-10-CM | POA: Diagnosis not present

## 2021-01-08 DIAGNOSIS — R7989 Other specified abnormal findings of blood chemistry: Secondary | ICD-10-CM | POA: Diagnosis not present

## 2021-01-08 DIAGNOSIS — I7090 Unspecified atherosclerosis: Secondary | ICD-10-CM | POA: Diagnosis not present

## 2021-01-08 DIAGNOSIS — R5383 Other fatigue: Secondary | ICD-10-CM | POA: Diagnosis not present

## 2021-01-14 DIAGNOSIS — R5383 Other fatigue: Secondary | ICD-10-CM | POA: Diagnosis not present

## 2021-02-12 DIAGNOSIS — R5383 Other fatigue: Secondary | ICD-10-CM | POA: Diagnosis not present

## 2021-03-10 DIAGNOSIS — R5383 Other fatigue: Secondary | ICD-10-CM | POA: Diagnosis not present

## 2021-04-07 DIAGNOSIS — R5383 Other fatigue: Secondary | ICD-10-CM | POA: Diagnosis not present

## 2021-05-06 DIAGNOSIS — R5383 Other fatigue: Secondary | ICD-10-CM | POA: Diagnosis not present

## 2021-06-02 DIAGNOSIS — R7989 Other specified abnormal findings of blood chemistry: Secondary | ICD-10-CM | POA: Diagnosis not present

## 2021-06-02 DIAGNOSIS — E559 Vitamin D deficiency, unspecified: Secondary | ICD-10-CM | POA: Diagnosis not present

## 2021-06-02 DIAGNOSIS — E782 Mixed hyperlipidemia: Secondary | ICD-10-CM | POA: Diagnosis not present

## 2021-06-02 DIAGNOSIS — R5383 Other fatigue: Secondary | ICD-10-CM | POA: Diagnosis not present

## 2021-06-02 DIAGNOSIS — I7090 Unspecified atherosclerosis: Secondary | ICD-10-CM | POA: Diagnosis not present

## 2021-06-10 DIAGNOSIS — R5383 Other fatigue: Secondary | ICD-10-CM | POA: Diagnosis not present

## 2021-06-19 ENCOUNTER — Ambulatory Visit
Admission: RE | Admit: 2021-06-19 | Discharge: 2021-06-19 | Disposition: A | Discharge status: Home / Self Care | Payer: Medicare Other | Source: Ambulatory Visit | Attending: Family Medicine | Admitting: Family Medicine

## 2021-06-19 DIAGNOSIS — Z780 Asymptomatic menopausal state: Secondary | ICD-10-CM | POA: Diagnosis not present

## 2021-06-19 DIAGNOSIS — M85851 Other specified disorders of bone density and structure, right thigh: Secondary | ICD-10-CM | POA: Diagnosis not present

## 2021-06-19 DIAGNOSIS — M8580 Other specified disorders of bone density and structure, unspecified site: Secondary | ICD-10-CM

## 2021-06-19 DIAGNOSIS — M810 Age-related osteoporosis without current pathological fracture: Secondary | ICD-10-CM | POA: Diagnosis not present

## 2021-07-02 DIAGNOSIS — M810 Age-related osteoporosis without current pathological fracture: Secondary | ICD-10-CM | POA: Diagnosis not present

## 2021-09-01 DIAGNOSIS — E559 Vitamin D deficiency, unspecified: Secondary | ICD-10-CM | POA: Diagnosis not present

## 2021-09-01 DIAGNOSIS — R7989 Other specified abnormal findings of blood chemistry: Secondary | ICD-10-CM | POA: Diagnosis not present

## 2021-09-01 DIAGNOSIS — E785 Hyperlipidemia, unspecified: Secondary | ICD-10-CM | POA: Diagnosis not present

## 2021-09-01 DIAGNOSIS — I7090 Unspecified atherosclerosis: Secondary | ICD-10-CM | POA: Diagnosis not present

## 2021-09-01 DIAGNOSIS — R5383 Other fatigue: Secondary | ICD-10-CM | POA: Diagnosis not present

## 2021-09-01 DIAGNOSIS — E782 Mixed hyperlipidemia: Secondary | ICD-10-CM | POA: Diagnosis not present

## 2021-09-08 DIAGNOSIS — R5383 Other fatigue: Secondary | ICD-10-CM | POA: Diagnosis not present

## 2021-12-04 ENCOUNTER — Ambulatory Visit
Admission: RE | Admit: 2021-12-04 | Discharge: 2021-12-04 | Disposition: A | Discharge status: Home / Self Care | Payer: Medicare Other | Source: Ambulatory Visit | Attending: Family Medicine | Admitting: Family Medicine

## 2021-12-04 ENCOUNTER — Other Ambulatory Visit: Payer: Self-pay | Admitting: Family Medicine

## 2021-12-04 DIAGNOSIS — H52203 Unspecified astigmatism, bilateral: Secondary | ICD-10-CM | POA: Diagnosis not present

## 2021-12-04 DIAGNOSIS — W19XXXA Unspecified fall, initial encounter: Secondary | ICD-10-CM

## 2021-12-04 DIAGNOSIS — Z961 Presence of intraocular lens: Secondary | ICD-10-CM | POA: Diagnosis not present

## 2021-12-04 DIAGNOSIS — M25531 Pain in right wrist: Secondary | ICD-10-CM

## 2022-07-22 DIAGNOSIS — M810 Age-related osteoporosis without current pathological fracture: Secondary | ICD-10-CM | POA: Diagnosis not present

## 2022-07-22 DIAGNOSIS — E7800 Pure hypercholesterolemia, unspecified: Secondary | ICD-10-CM | POA: Diagnosis not present

## 2022-07-22 DIAGNOSIS — R5383 Other fatigue: Secondary | ICD-10-CM | POA: Diagnosis not present

## 2022-07-22 DIAGNOSIS — Z0000 Encounter for general adult medical examination without abnormal findings: Secondary | ICD-10-CM | POA: Diagnosis not present

## 2022-07-22 DIAGNOSIS — R7301 Impaired fasting glucose: Secondary | ICD-10-CM | POA: Diagnosis not present

## 2022-08-19 DIAGNOSIS — K449 Diaphragmatic hernia without obstruction or gangrene: Secondary | ICD-10-CM | POA: Diagnosis not present

## 2022-08-19 DIAGNOSIS — K219 Gastro-esophageal reflux disease without esophagitis: Secondary | ICD-10-CM | POA: Diagnosis not present

## 2022-08-19 DIAGNOSIS — K2270 Barrett's esophagus without dysplasia: Secondary | ICD-10-CM | POA: Diagnosis not present

## 2022-08-19 DIAGNOSIS — R1310 Dysphagia, unspecified: Secondary | ICD-10-CM | POA: Diagnosis not present

## 2022-09-01 DIAGNOSIS — D649 Anemia, unspecified: Secondary | ICD-10-CM | POA: Diagnosis not present

## 2022-09-08 DIAGNOSIS — M25562 Pain in left knee: Secondary | ICD-10-CM | POA: Diagnosis not present

## 2022-09-08 DIAGNOSIS — K219 Gastro-esophageal reflux disease without esophagitis: Secondary | ICD-10-CM | POA: Diagnosis not present

## 2022-09-08 DIAGNOSIS — M1712 Unilateral primary osteoarthritis, left knee: Secondary | ICD-10-CM | POA: Diagnosis not present

## 2022-09-08 DIAGNOSIS — M79642 Pain in left hand: Secondary | ICD-10-CM | POA: Diagnosis not present

## 2022-09-08 DIAGNOSIS — M1812 Unilateral primary osteoarthritis of first carpometacarpal joint, left hand: Secondary | ICD-10-CM | POA: Diagnosis not present

## 2022-09-08 DIAGNOSIS — K638219 Small intestinal bacterial overgrowth, unspecified: Secondary | ICD-10-CM | POA: Diagnosis not present

## 2022-09-08 DIAGNOSIS — D649 Anemia, unspecified: Secondary | ICD-10-CM | POA: Diagnosis not present

## 2022-09-08 DIAGNOSIS — M810 Age-related osteoporosis without current pathological fracture: Secondary | ICD-10-CM | POA: Diagnosis not present

## 2022-09-08 DIAGNOSIS — G8929 Other chronic pain: Secondary | ICD-10-CM | POA: Diagnosis not present

## 2022-10-19 ENCOUNTER — Other Ambulatory Visit: Payer: Self-pay | Admitting: Otolaryngology

## 2022-10-19 DIAGNOSIS — K219 Gastro-esophageal reflux disease without esophagitis: Secondary | ICD-10-CM

## 2022-10-19 DIAGNOSIS — R1314 Dysphagia, pharyngoesophageal phase: Secondary | ICD-10-CM

## 2022-10-30 ENCOUNTER — Ambulatory Visit
Admission: RE | Admit: 2022-10-30 | Discharge: 2022-10-30 | Disposition: A | Discharge status: Home / Self Care | Payer: Medicare Other | Source: Ambulatory Visit | Attending: Otolaryngology | Admitting: Otolaryngology

## 2022-10-30 DIAGNOSIS — R1314 Dysphagia, pharyngoesophageal phase: Secondary | ICD-10-CM

## 2022-10-30 DIAGNOSIS — K219 Gastro-esophageal reflux disease without esophagitis: Secondary | ICD-10-CM

## 2022-11-16 ENCOUNTER — Telehealth: Payer: Self-pay | Admitting: Internal Medicine

## 2022-11-16 NOTE — Telephone Encounter (Signed)
Hi Dr Rhea Belton,     This patient is requesting her care to be transferred over specifically to you. Would like to be seen for a possible endoscopy. Has history with Dr. Elnoria Howard. States she was not happy with the care that was provided. States her cousin is a patient of yours and has been very happy with your care and referred patient to you. Her records are in [facility] for you to review and advise on scheduling.   Thank you.

## 2022-11-17 ENCOUNTER — Emergency Department (HOSPITAL_BASED_OUTPATIENT_CLINIC_OR_DEPARTMENT_OTHER)
Admission: EM | Admit: 2022-11-17 | Discharge: 2022-11-17 | Disposition: A | Discharge status: Home / Self Care | Payer: Medicare Other | Attending: Emergency Medicine | Admitting: Emergency Medicine

## 2022-11-17 ENCOUNTER — Encounter (HOSPITAL_BASED_OUTPATIENT_CLINIC_OR_DEPARTMENT_OTHER): Payer: Self-pay

## 2022-11-17 ENCOUNTER — Other Ambulatory Visit: Payer: Self-pay

## 2022-11-17 ENCOUNTER — Emergency Department (HOSPITAL_BASED_OUTPATIENT_CLINIC_OR_DEPARTMENT_OTHER): Payer: Medicare Other | Admitting: Radiology

## 2022-11-17 ENCOUNTER — Encounter: Payer: Self-pay | Admitting: Internal Medicine

## 2022-11-17 DIAGNOSIS — S42141A Displaced fracture of glenoid cavity of scapula, right shoulder, initial encounter for closed fracture: Secondary | ICD-10-CM | POA: Diagnosis not present

## 2022-11-17 DIAGNOSIS — Y93H2 Activity, gardening and landscaping: Secondary | ICD-10-CM | POA: Diagnosis not present

## 2022-11-17 DIAGNOSIS — W010XXA Fall on same level from slipping, tripping and stumbling without subsequent striking against object, initial encounter: Secondary | ICD-10-CM | POA: Insufficient documentation

## 2022-11-17 DIAGNOSIS — Y92007 Garden or yard of unspecified non-institutional (private) residence as the place of occurrence of the external cause: Secondary | ICD-10-CM | POA: Diagnosis not present

## 2022-11-17 DIAGNOSIS — M25511 Pain in right shoulder: Secondary | ICD-10-CM | POA: Diagnosis present

## 2022-11-17 MED ORDER — ACETAMINOPHEN 500 MG PO TABS: 1000.0000 mg | ORAL_TABLET | Freq: Once | ORAL | Status: DC

## 2022-11-17 MED ORDER — ACETAMINOPHEN 160 MG/5ML PO SOLN
650.0000 mg | Freq: Once | ORAL | Status: AC
  Administered 2022-11-17: 650 mg via ORAL
  Filled 2022-11-17: qty 20.3

## 2022-11-17 NOTE — ED Provider Notes (Signed)
[place] [facility]PART[facility] AT [facility] Provider Note   CSN: [id number] Arrival date & time: 11/17/22  1339     History Chief Complaint  Patient presents with   Fall    HPI Tammy Harrell is a 82 y.o. female presenting for chief complaint of ground-level fall.  She is a 82 year old female with a relatively minimal medical history. States that she was in a ground-level fall earlier today while in the garden.  Fell directly onto her right shoulder.  Has pain primarily around her right AC.  Pain with ABduction of the right shoulder but otherwise neurovascularly intact.  Denies fevers chills nausea vomiting syncope shortness of breath.  Is not on blood thinners did not hit her head..   Patient's recorded medical, surgical, social, medication list and allergies were reviewed in the Snapshot window as part of the initial history.   Review of Systems   Review of Systems  Constitutional:  Negative for chills and fever.  HENT:  Negative for ear pain and sore throat.   Eyes:  Negative for pain and visual disturbance.  Respiratory:  Negative for cough and shortness of breath.   Cardiovascular:  Negative for chest pain and palpitations.  Gastrointestinal:  Negative for abdominal pain and vomiting.  Genitourinary:  Negative for dysuria and hematuria.  Musculoskeletal:  Negative for arthralgias and back pain.  Skin:  Negative for color change and rash.  Neurological:  Negative for seizures and syncope.  All other systems reviewed and are negative.   Physical Exam Updated Vital Signs BP (!) 181/71 (BP Location: Left Arm)   Pulse 82   Temp 98.4 F (36.9 C) (Oral)   Resp 17   Ht 5\' 4"  (1.626 m)   Wt 82.6 kg   SpO2 99%   BMI 31.24 kg/m  Physical Exam Vitals and nursing note reviewed.  Constitutional:      General: She is not in acute distress.    Appearance: She is well-developed.  HENT:     Head: Normocephalic and atraumatic.  Eyes:     Conjunctiva/sclera:  Conjunctivae normal.  Cardiovascular:     Rate and Rhythm: Normal rate and regular rhythm.     Heart sounds: No murmur heard. Pulmonary:     Effort: Pulmonary effort is normal. No respiratory distress.     Breath sounds: Normal breath sounds.  Abdominal:     General: There is no distension.     Palpations: Abdomen is soft.     Tenderness: There is no abdominal tenderness. There is no right CVA tenderness or left CVA tenderness.  Musculoskeletal:        General: No swelling or tenderness. Normal range of motion.     Cervical back: Neck supple.     Comments: Right shoulder pain.  Skin:    General: Skin is warm and dry.  Neurological:     General: No focal deficit present.     Mental Status: She is alert and oriented to person, place, and time. Mental status is at baseline.     Cranial Nerves: No cranial nerve deficit.      ED Course/ Medical Decision Making/ A&P    Procedures Procedures   Medications Ordered in ED Medications  acetaminophen (TYLENOL) 160 MG/5ML solution 650 mg (650 mg Oral Given 11/17/22 1640)   Medical Decision Making:    Tammy Harrell is a 82 y.o. female who presented to the ED today with a moderate mechanisma trauma, detailed above.  Given this mechanism of trauma, a full physical exam was performed. Notably, patient was hemodynamically stable no acute distress.   Reviewed and confirmed nursing documentation for past medical history, family history, social history.    Initial Assessment/Plan:   This is a patient presenting with a moderate mechanism trauma.  As such, I have considered intracranial injuries including intracranial hemorrhage, intrathoracic injuries including blunt myocardial or blunt lung injury, blunt abdominal injuries including aortic dissection, bladder injury, spleen injury, liver injury and I have considered orthopedic injuries including extremity or spinal injury.  With the patient's presentation of moderate mechanism trauma but  an otherwise reassuring exam, patient warrants targeted evaluation for potential traumatic injuries. Will proceed with targeted evaluation for potential injuries. Will proceed with right arm x-ray. Objective evaluation resulted with AC slight separation with bone fragments.  Radiology report similar with likely intra-articular fracture.  Final Reassessment and Plan:   Will place patient into a right arm sling have her follow-up with orthopedics in the outpatient setting.    Disposition:  I have considered need for hospitalization, however, considering all of the above, I believe this patient is stable for discharge at this time.  Patient/family educated about specific return precautions for given chief complaint and symptoms.  Patient/family educated about follow-up with PCP.     Patient/family expressed understanding of return precautions and need for follow-up. Patient spoken to regarding all imaging and laboratory results and appropriate follow up for these results. All education provided in verbal form with additional information in written form. Time was allowed for answering of patient questions. Patient discharged.    Emergency Department Medication Summary:   Medications  acetaminophen (TYLENOL) 160 MG/5ML solution 650 mg (650 mg Oral Given 11/17/22 1640)         Clinical Impression:  1. Closed fracture of glenoid cavity and neck of right scapula, initial encounter      Discharge   Final Clinical Impression(s) / ED Diagnoses Final diagnoses:  Closed fracture of glenoid cavity and neck of right scapula, initial encounter    Rx / DC Orders ED Discharge Orders     None         Glyn Ade, MD 11/17/22 1641

## 2022-11-17 NOTE — Telephone Encounter (Signed)
ok 

## 2022-11-17 NOTE — ED Triage Notes (Signed)
Ambulatory to triage. A+Ox4. NAD.  Reports falling around 1300. Tripped while gardening and fell onto right shoulder. Pulses intact- no deformities noted- difficulty abducting arm. No head trauma, no neck pain, no back pain. No thinners.

## 2022-12-01 ENCOUNTER — Emergency Department (HOSPITAL_COMMUNITY): Payer: Medicare Other

## 2022-12-01 ENCOUNTER — Other Ambulatory Visit: Payer: Self-pay

## 2022-12-01 ENCOUNTER — Emergency Department (HOSPITAL_COMMUNITY)
Admission: EM | Admit: 2022-12-01 | Discharge: 2022-12-02 | Disposition: A | Discharge status: Home / Self Care | Payer: Medicare Other | Attending: Emergency Medicine | Admitting: Emergency Medicine

## 2022-12-01 DIAGNOSIS — Z1152 Encounter for screening for COVID-19: Secondary | ICD-10-CM | POA: Diagnosis not present

## 2022-12-01 DIAGNOSIS — R42 Dizziness and giddiness: Secondary | ICD-10-CM | POA: Diagnosis present

## 2022-12-01 DIAGNOSIS — I10 Essential (primary) hypertension: Secondary | ICD-10-CM | POA: Diagnosis not present

## 2022-12-01 LAB — COMPREHENSIVE METABOLIC PANEL
ALT: 17 U/L (ref 0–44)
AST: 19 U/L (ref 15–41)
Albumin: 3.1 g/dL — ABNORMAL LOW (ref 3.5–5.0)
Alkaline Phosphatase: 98 U/L (ref 38–126)
Anion gap: 12 (ref 5–15)
BUN: 19 mg/dL (ref 8–23)
CO2: 22 mmol/L (ref 22–32)
Calcium: 9 mg/dL (ref 8.9–10.3)
Chloride: 101 mmol/L (ref 98–111)
Creatinine, Ser: 0.74 mg/dL (ref 0.44–1.00)
GFR, Estimated: >60 mL/min (ref >60)
Glucose, Bld: 116 mg/dL — ABNORMAL HIGH (ref 70–99)
Potassium: 3.9 mmol/L (ref 3.5–5.1)
Sodium: 135 mmol/L (ref 135–145)
Total Bilirubin: 0.5 mg/dL (ref 0.3–1.2)
Total Protein: 6.7 g/dL (ref 6.5–8.1)

## 2022-12-01 LAB — CBC WITH DIFFERENTIAL/PLATELET
Abs Immature Granulocytes: 0.01 10*3/uL (ref 0.00–0.07)
Basophils Absolute: 0 10*3/uL (ref 0.0–0.1)
Basophils Relative: 1 %
Eosinophils Absolute: 0 10*3/uL (ref 0.0–0.5)
Eosinophils Relative: 0 %
HCT: 31.2 % — ABNORMAL LOW (ref 36.0–46.0)
Hemoglobin: 10 g/dL — ABNORMAL LOW (ref 12.0–15.0)
Immature Granulocytes: 0 %
Lymphocytes Relative: 12 %
Lymphs Abs: 0.8 10*3/uL (ref 0.7–4.0)
MCH: 23.8 pg — ABNORMAL LOW (ref 26.0–34.0)
MCHC: 32.1 g/dL (ref 30.0–36.0)
MCV: 74.3 fL — ABNORMAL LOW (ref 80.0–100.0)
Monocytes Absolute: 0.2 10*3/uL (ref 0.1–1.0)
Monocytes Relative: 3 %
Neutro Abs: 5.1 10*3/uL (ref 1.7–7.7)
Neutrophils Relative %: 84 %
Platelets: 348 10*3/uL (ref 150–400)
RBC: 4.2 MIL/uL (ref 3.87–5.11)
RDW: 18.3 % — ABNORMAL HIGH (ref 11.5–15.5)
WBC: 6.1 10*3/uL (ref 4.0–10.5)
nRBC: 0 % (ref 0.0–0.2)

## 2022-12-01 LAB — SARS CORONAVIRUS 2 BY RT PCR: SARS Coronavirus 2 by RT PCR: NEGATIVE

## 2022-12-01 LAB — TROPONIN I (HIGH SENSITIVITY): Troponin I (High Sensitivity): 6 ng/L (ref <18)

## 2022-12-01 NOTE — Discharge Instructions (Addendum)
No signs of bleeding on your brain scan today

## 2022-12-01 NOTE — ED Triage Notes (Signed)
Pt BIB EMS from home. C/o dizziness that started while sitting on the couch, pt went to lay down, then started having nausea, vomiting, and diarrhea.  Pt fell on 11/17/22 and reports noticing "a soft spot" on her head since being discharged.    EMS VS: Manual BP: 230/86 (No hx of hypertension.) Cb 136, 98% RA, HR 70's

## 2022-12-01 NOTE — ED Provider Notes (Signed)
[place] [facility]GENC[facility] DE[facility]MENT AT [facility] Provider Note   CSN: [id number] Arrival date & time: 12/01/22  1956     History  Chief Complaint  Patient presents with   Hypertension   Dizziness    Tammy Harrell is a 82 y.o. female.  Patient is an 82 year old female with a history of GERD and recent fall at the beginning of July with a shoulder injury who is presenting tonight after having an episode of feeling dizzy, off balance resulting in nausea and vomiting and also several episodes of diarrhea.  Patient reports that now her symptoms have completely resolved but her blood pressure has continued to be elevated which is unusual for her as she does not have a history of high blood pressure and does not take any medication.  She reports that after her fall on July 4 she has had some intermittent episodes of feeling a bit off balance intermittently and had noticed that she hit the right side of her head but did not know it at the time until after she got home and was combing her hair.  She has not had a headache or vision changes.  Today around 3 or 4:00 is when she started having symptoms.  She went to lay down but reports laying down made her feel even worse she called her neighbor who came over and that is when she had the more severe episode where she vomited several times reports that her face turned red her vision was going in and out and she felt unwell.  She denies fever, cough, shortness of breath, chest pain.    The history is provided by the patient.  Hypertension  Dizziness      Home Medications Prior to Admission medications   Medication Sig Start Date End Date Taking? Authorizing Provider  esomeprazole (NEXIUM) 40 MG capsule Take 40 mg by mouth 2 (two) times daily. 09/14/19   [provider]  Ginger, Zingiber officinalis, (GINGER PO) Take 1 Dose by mouth in the morning and at bedtime.    [provider]  HOMEOPATHIC PRODUCTS PO Place 1 tablet  under the tongue daily. Natsol Homeopathic Enzyme & Roggia Homeopathic Supplement    [provider]      Allergies    Codeine    Review of Systems   Review of Systems  Neurological:  Positive for dizziness.    Physical Exam Updated Vital Signs BP (!) 157/68   Pulse 74   Temp 98.6 F (37 C) (Oral)   Resp 13   SpO2 97%  Physical Exam Vitals and nursing note reviewed.  Constitutional:      General: She is not in acute distress.    Appearance: She is well-developed.  HENT:     Head: Normocephalic and atraumatic.  Eyes:     General: No visual field deficit.    Pupils: Pupils are equal, round, and reactive to light.  Cardiovascular:     Rate and Rhythm: Normal rate and regular rhythm.     Heart sounds: Normal heart sounds. No murmur heard.    No friction rub.  Pulmonary:     Effort: Pulmonary effort is normal.     Breath sounds: Normal breath sounds. No wheezing or rales.  Abdominal:     General: Bowel sounds are normal. There is no distension.     Palpations: Abdomen is soft.     Tenderness: There is no abdominal tenderness. There is no guarding or rebound.  Musculoskeletal:  General: No tenderness. Normal range of motion.     Right lower leg: No edema.     Left lower leg: No edema.     Comments: No edema.  Right upper extremity in a sling  Skin:    General: Skin is warm and dry.     Findings: No rash.  Neurological:     Mental Status: She is alert and oriented to person, place, and time.     Cranial Nerves: No cranial nerve deficit or dysarthria.     Sensory: Sensation is intact.     Motor: Motor function is intact. No pronator drift.     Coordination: Coordination is intact. Coordination normal. Heel to Shin Test normal.     Gait: Gait is intact.     Comments: No nystagmus.  Patient was able to ambulate here to the bathroom and back to the room and reports symptoms are gone  Psychiatric:        Mood and Affect: Mood normal.        Behavior:  Behavior normal.     ED Results / Procedures / Treatments   Labs (all labs ordered are listed, but only abnormal results are displayed) Labs Reviewed  CBC WITH DIFFERENTIAL/PLATELET - Abnormal; Notable for the following components:      Result Value   Hemoglobin 10.0 (*)    HCT 31.2 (*)    MCV 74.3 (*)    MCH 23.8 (*)    RDW 18.3 (*)    All other components within normal limits  COMPREHENSIVE METABOLIC PANEL - Abnormal; Notable for the following components:   Glucose, Bld 116 (*)    Albumin 3.1 (*)    All other components within normal limits  SARS CORONAVIRUS 2 BY RT PCR  TROPONIN I (HIGH SENSITIVITY)  TROPONIN I (HIGH SENSITIVITY)    EKG EKG Interpretation Date/Time:  Tuesday December 01 2022 20:21:17 EDT Ventricular Rate:  77 PR Interval:  149 QRS Duration:  158 QT Interval:  436 QTC Calculation: 494 R Axis:   72  Text Interpretation: Sinus rhythm Right bundle branch block No previous tracing Confirmed by Gwyneth Sprout ([id number]) on 12/01/2022 8:24:10 PM  Radiology CT Head Wo Contrast  Result Date: 12/01/2022 CLINICAL DATA:  Head trauma EXAM: CT HEAD WITHOUT CONTRAST TECHNIQUE: Contiguous axial images were obtained from the base of the skull through the vertex without intravenous contrast. RADIATION DOSE REDUCTION: This exam was performed according to the departmental dose-optimization program which includes automated exposure control, adjustment of the mA and/or kV according to patient size and/or use of iterative reconstruction technique. COMPARISON:  None Available. FINDINGS: Brain: No evidence of acute infarction, hemorrhage, hydrocephalus, extra-axial collection or mass lesion/mass effect. Patchy areas of low-attenuation of the periventricular white matter presumed chronic microvascular ischemic changes. Vascular: No hyperdense vessel or unexpected calcification. Skull: Normal. Negative for fracture or focal lesion. Sinuses/Orbits: Complete opacification of the right  maxillary sinus. Other: None. IMPRESSION: 1. No acute intracranial abnormality. 2. Chronic microvascular ischemic changes of the periventricular white matter. 3. Complete opacification of the right maxillary sinus. Electronically Signed   By: Larose Hires D.O.   On: 12/01/2022 23:01   DG Chest Port 1 View  Result Date: 12/01/2022 CLINICAL DATA:  Nausea, vomiting, dizziness, diarrhea EXAM: PORTABLE CHEST 1 VIEW COMPARISON:  Radiographs 09/08/2014 FINDINGS: Stable cardiomediastinal silhouette. Aortic atherosclerotic calcification. Diffuse interstitial coarsening. No focal consolidation, pleural effusion, or pneumothorax. No displaced rib fractures. IMPRESSION: Diffuse interstitial coarsening is nonspecific and may be chronic or  due to edema or atypical infection. Electronically Signed   By: Minerva Fester M.D.   On: 12/01/2022 22:07    Procedures Procedures    Medications Ordered in ED Medications - No data to display  ED Course/ Medical Decision Making/ A&P                             Medical Decision Making Amount and/or Complexity of Data Reviewed External Data Reviewed: notes. Labs: ordered. Decision-making details documented in ED Course. Radiology: ordered and independent interpretation performed. Decision-making details documented in ED Course. ECG/medicine tests: ordered and independent interpretation performed. Decision-making details documented in ED Course.   Pt with multiple medical problems and comorbidities and presenting today with a complaint that caries a high risk for morbidity and mortality.  Here today with an episode of dizziness that seems most consistent with peripheral vertigo versus near syncope due to recent diarrhea and vomiting versus a vagal event versus infectious etiology such as COVID.  Versus an acute intracranial pathology given she did fall and hit her head on the fourth and has had some mild intermittent balance issues since that time.  Patient is  well-appearing here.  Neurologic exam is normal.  Patient was able to ambulate to the bathroom and back without difficulty.  Low suspicion for TIA today based on patient's symptoms.  Low suspicion for PE here acute dysrhythmia. I independently interpreted patient's EKG which showed a right bundle branch block but normal sinus rhythm.  Patient has no prior EKGs to compare.  I independently interpreted patient's labs and troponin, COVID, CMP, CBC all without acute findings.  Hemoglobin of 10 but appears stable.  I have independently visualized and interpreted pt's images today.  Chest x-ray without acute findings today, head CT without evidence of bleed.  Radiology reports complete opacification of the right maxillary sinus and chronic microvascular ischemic changes of the periventricular white matter but no acute intercranial abnormalities.  Patient multiple times now has been able to get out of bed walk to the bathroom without any evidence of ataxia and denies any recurrent dizziness.  Blood pressure has been 150s over 60s most of her stay here.  She reports typically when she is seen in the office is in the 140s.  At this time low suspicion for stroke, dysrhythmia or ACS.  At this time patient does not need further testing and appears stable for discharge home but did recommend following up with PCP.  Patient and family member present in the room with her comfortable with this plan.          Final Clinical Impression(s) / ED Diagnoses Final diagnoses:  Vertigo  Hypertension, unspecified type    Rx / DC Orders ED Discharge Orders     None         Gwyneth Sprout, MD 12/01/22 2342

## 2022-12-01 NOTE — ED Notes (Addendum)
Pt ambulatory to restroom with steady gait.  Pt endorses feeling better overall at this time.

## 2022-12-02 LAB — TROPONIN I (HIGH SENSITIVITY): Troponin I (High Sensitivity): 6 ng/L

## 2022-12-02 NOTE — ED Notes (Signed)
Pt provided with blue pants for upcoming discharge.

## 2022-12-16 ENCOUNTER — Encounter: Payer: Self-pay | Admitting: Internal Medicine

## 2022-12-16 ENCOUNTER — Ambulatory Visit: Payer: Medicare Other | Admitting: Internal Medicine

## 2022-12-16 VITALS — BP 170/80 | HR 100 | Ht 64.0 in | Wt 179.2 lb

## 2022-12-16 DIAGNOSIS — D509 Iron deficiency anemia, unspecified: Secondary | ICD-10-CM

## 2022-12-16 DIAGNOSIS — K219 Gastro-esophageal reflux disease without esophagitis: Secondary | ICD-10-CM | POA: Diagnosis not present

## 2022-12-16 DIAGNOSIS — K2270 Barrett's esophagus without dysplasia: Secondary | ICD-10-CM | POA: Diagnosis not present

## 2022-12-16 DIAGNOSIS — K449 Diaphragmatic hernia without obstruction or gangrene: Secondary | ICD-10-CM | POA: Diagnosis not present

## 2022-12-16 MED ORDER — NA SULFATE-K SULFATE-MG SULF 17.5-3.13-1.6 GM/177ML PO SOLN: 1.0000 | Freq: Once | ORAL | 0 refills | Status: AC

## 2022-12-16 NOTE — Progress Notes (Signed)
Patient ID: Tammy Harrell, female   DOB: [birth year], 82 y.o.   MRN: [id number] HPI: Tammy Harrell is an 82 year old female with a history of GERD, Barrett's esophagus without known dysplasia, large hiatal hernia, remote colon polyps, osteopenia, arthritis who is here to establish care.  She also has a recently discovered iron deficiency anemia.  She is here alone today.  Her GI care most recently was with Dr. Elnoria Harrell in 2021 where she had her last EGD.  Prior to this Tammy Harrell GI with Dr. Dulce Harrell.  She reports that she is feeling well though she was having issues with swallowing particularly certain foods like dense breads.  Sipping water, chewing well and eating slowly has helped her symptoms.  She requested a barium esophagram and had this done and was told that this was very enlightening.  The radiologist was able to explain to her what esophageal dysmotility is.  This is helped her symptoms since the study was done.  She does not have heartburn on a regular basis but can if she eats larger meals.  She previously took Nexium but worked her way off of this through her homeopathic medicine doctor.  Her dysphagia is with dense foods as described above.  Prior dilation with Dr. Audley Harrell did not help this symptom.  She also wishes to avoid PPI if possible because of the risks of these medicines including osteoporosis.  She prefers a more homeopathic approach.  She has noticed decreased energy and was told by primary care that her hemoglobin and iron levels were low.  She has been on oral iron for over 3 months with iron studies checked recently revealed that she remains iron deficient and anemic.  She wishes to know more about this.  She also fell on 7-24 and injured her right shoulder.  There was a fracture not requiring surgery which she is being treated for medically.  She has follow-up for this in August.    Past Medical History:  Diagnosis Date   Anemia    Arthritis    Asthma    Barrett's esophagus     Cataract    Colon polyps    Family history of adverse reaction to anesthesia    sister hard to wake up   GERD (gastroesophageal reflux disease)    Hernia, diaphragmatic, without obstruction    Hiatal hernia    Osteopenia     Past Surgical History:  Procedure Laterality Date   BIOPSY  10/13/2019   Procedure: BIOPSY;  Surgeon: Tammy Hawking, MD;  Location: [facility];  Service: Endoscopy;;   CATARACT EXTRACTION Bilateral    ESOPHAGOGASTRODUODENOSCOPY (EGD) WITH PROPOFOL N/A 10/13/2019   Procedure: ESOPHAGOGASTRODUODENOSCOPY (EGD) WITH PROPOFOL;  Surgeon: Tammy Hawking, MD;  Location: [facility];  Service: Endoscopy;  Laterality: N/A;   MENISCUS REPAIR Left    SAVORY DILATION N/A 10/13/2019   Procedure: SAVORY DILATION;  Surgeon: Tammy Hawking, MD;  Location: [facility];  Service: Endoscopy;  Laterality: N/A;   TUBAL LIGATION      Outpatient Medications Prior to Visit  Medication Sig Dispense Refill   Ascorbic Acid (VITA-C PO) Take 1 Dose by mouth daily.     BORON PO Take 1 Dose by mouth daily.     FERROUS SULFATE PO Take 1 Dose by mouth daily.     Ginger, Zingiber officinalis, (GINGER PO) Take by mouth. Drinking ginger tea     Niacin (VITAMIN B-3 PO) Take 1 Dose by mouth daily.     Probiotic Product (PROBIOTIC PO) Take  2 tablets by mouth daily. Gummies, 2 dbioticsifferent pro     Vitamin D-Vitamin K (VITAMIN K2-VITAMIN D3 PO) Take 1 Dose by mouth daily.     esomeprazole (NEXIUM) 40 MG capsule Take 40 mg by mouth 2 (two) times daily.     HOMEOPATHIC PRODUCTS PO Place 1 tablet under the tongue daily. Natsol Homeopathic Enzyme & Roggia Homeopathic Supplement     No facility-administered medications prior to visit.    Allergies  Allergen Reactions   Codeine Nausea Only    Family History  Problem Relation Age of Onset   COPD Mother    Heart disease Mother    Cirrhosis Father    Lung cancer Sister    Breast cancer Sister    Other Brother        bacteria in the blood    Heart disease Maternal Grandmother    Heart disease Maternal Grandfather    Heart disease Paternal Grandmother    Heart disease Paternal Grandfather     Social History   Tobacco Use   Smoking status: Never   Smokeless tobacco: Never  Vaping Use   Vaping status: Never Used  Substance Use Topics   Alcohol use: No   Drug use: No    ROS: As per history of present illness, otherwise negative  BP (!) 170/80 (BP Location: Left Arm, Patient Position: Sitting, Cuff Size: Normal)   Pulse 100   Ht 5\' 4"  (1.626 m) Comment: height measured without shoes  Wt 179 lb 4 oz (81.3 kg)   BMI 30.77 kg/m  Gen: awake, alert, NAD HEENT: anicteric  CV: RRR, no mrg Pulm: CTA b/l Abd: soft, NT/ND, +BS throughout Ext: no c/c/e Neuro: nonfocal   RELEVANT LABS AND IMAGING: CBC    Component Value Date/Time   WBC 6.1 12/01/2022 2108   RBC 4.20 12/01/2022 2108   HGB 10.0 (L) 12/01/2022 2108   HCT 31.2 (L) 12/01/2022 2108   PLT 348 12/01/2022 2108   MCV 74.3 (L) 12/01/2022 2108   MCH 23.8 (L) 12/01/2022 2108   MCHC 32.1 12/01/2022 2108   RDW 18.3 (H) 12/01/2022 2108   LYMPHSABS 0.8 12/01/2022 2108   MONOABS 0.2 12/01/2022 2108   EOSABS 0.0 12/01/2022 2108   BASOSABS 0.0 12/01/2022 2108    CMP     Component Value Date/Time   NA 135 12/01/2022 2108   K 3.9 12/01/2022 2108   CL 101 12/01/2022 2108   CO2 22 12/01/2022 2108   GLUCOSE 116 (H) 12/01/2022 2108   BUN 19 12/01/2022 2108   CREATININE 0.74 12/01/2022 2108   CALCIUM 9.0 12/01/2022 2108   PROT 6.7 12/01/2022 2108   ALBUMIN 3.1 (L) 12/01/2022 2108   AST 19 12/01/2022 2108   ALT 17 12/01/2022 2108   ALKPHOS 98 12/01/2022 2108   BILITOT 0.5 12/01/2022 2108   GFRNONAA >60 12/01/2022 2108   Iron studies dated 12/03/2022 from [facility] B12 422, folate 5.4 Ferritin 13, percent saturation 6, TIBC 433 Hemoglobin 9.8, MCV 76, platelet count and white count normal  EGD with Dr. Elnoria Harrell on 10/13/2019.  7 cm hiatal hernia.  18 mm Savary  dilator passed.  5 cm of Barrett's esophagus biopsied.  Pathology = Barrett's without dysplasia  ASSESSMENT/PLAN: 82 year old female with a history of GERD, Barrett's esophagus without known dysplasia, large hiatal hernia, remote colon polyps, osteopenia, arthritis who is here to establish care.    Barrett's esophagus/GERD/large hiatal hernia --we discussed reflux, Barrett's esophagus and hiatal hernia.  Her hiatal hernia definitely  sets her up for reflux.  It could also possibly explain IDA.  She prefers to not be on a PPI which certainly carries some risk given known data showing acid suppression reduces the risk of Barrett's progression and dysplasia.  I definitively recommended we repeat upper endoscopy for biopsy.  We discussed the risk, benefits and alternatives and she is agreeable -- Upper endoscopy in the [facility] for Barrett's surveillance and evaluation of IDA  2.  IDA --no clear etiology.  3 g hemoglobin drop in about a year.  She has been on oral iron x 3 months and not responded her ferritin remains low.  IV iron is recommended and I will order today through the infusion clinic.  Colonoscopy recommended in addition to upper endoscopy, see #1, to evaluate her iron deficiency.  We reviewed the risk, benefits and alternatives to upper and lower endoscopy and she is agreeable and wishes to proceed.  If no source found capsule endoscopy to be recommended -- IV iron; infusion center orders placed via pharmacy -- Colonoscopy in the [facility]      Cc:Shawnie Dapper, Pa-c [address] [place],  [place] [address]

## 2022-12-16 NOTE — Patient Instructions (Signed)
We will obtain your colonoscopy from [facility].   You have been scheduled for an endoscopy and colonoscopy. Please follow the written instructions given to you at your visit today.  Please pick up your prep supplies at the pharmacy within the next 1-3 days.  If you use inhalers (even only as needed), please bring them with you on the day of your procedure.  DO NOT TAKE 7 DAYS PRIOR TO TEST- Trulicity (dulaglutide) Ozempic, Wegovy (semaglutide) Mounjaro (tirzepatide) Bydureon Bcise (exanatide extended release)  DO NOT TAKE 1 DAY PRIOR TO YOUR TEST Rybelsus (semaglutide) Adlyxin (lixisenatide) Victoza (liraglutide) Byetta (exanatide) ______________________________________________________________________   If your blood pressure at your visit was 140/90 or greater, please contact your primary care physician to follow up on this.  _______________________________________________________  If you are age 61 or older, your body mass index should be between 23-30. Your Body mass index is 30.77 kg/m. If this is out of the aforementioned range listed, please consider follow up with your Primary Care Provider.  If you are age 73 or younger, your body mass index should be between 19-25. Your Body mass index is 30.77 kg/m. If this is out of the aformentioned range listed, please consider follow up with your Primary Care Provider.   ________________________________________________________  The  GI providers would like to encourage you to use [facility] to communicate with providers for non-urgent requests or questions.  Due to long hold times on the telephone, sending your provider a message by [facility] may be a faster and more efficient way to get a response.  Please allow 48 business hours for a response.  Please remember that this is for non-urgent requests.  _______________________________________________________

## 2022-12-18 ENCOUNTER — Telehealth: Payer: Self-pay

## 2022-12-18 NOTE — Telephone Encounter (Signed)
Tammy Harrell,  FYI NOTE, Patient will be scheduled as soon as possible.  Auth Submission: NO AUTH NEEDED Site of care: Site of care: [facility] Payer: [employer] Medicare Medication & CPT/J Code(s) submitted: Feraheme (ferumoxytol) [id number] Route of submission (phone, fax, portal):  Phone # Fax # Auth type: Buy/Bill PB Units/visits requested: 2 doses Reference number: [id number] Approval from: 12/18/22 to 04/19/23

## 2022-12-24 ENCOUNTER — Ambulatory Visit (INDEPENDENT_AMBULATORY_CARE_PROVIDER_SITE_OTHER): Payer: Medicare Other | Admitting: *Deleted

## 2022-12-24 VITALS — BP 144/71 | HR 65 | Temp 97.8°F | Resp 17 | Ht 64.0 in | Wt 183.2 lb

## 2022-12-24 DIAGNOSIS — D509 Iron deficiency anemia, unspecified: Secondary | ICD-10-CM

## 2022-12-24 MED ORDER — SODIUM CHLORIDE 0.9 % IV SOLN
510.0000 mg | Freq: Once | INTRAVENOUS | Status: AC
  Administered 2022-12-24: 510 mg via INTRAVENOUS
  Filled 2022-12-24: qty 17

## 2022-12-24 MED ORDER — ACETAMINOPHEN 325 MG PO TABS: 650.0000 mg | ORAL_TABLET | Freq: Once | ORAL | Status: DC

## 2022-12-24 MED ORDER — DIPHENHYDRAMINE HCL 25 MG PO CAPS: 25.0000 mg | ORAL_CAPSULE | Freq: Once | ORAL | Status: DC

## 2022-12-24 NOTE — Progress Notes (Signed)
Diagnosis: Iron Deficiency Anemia  Provider:  Chilton Greathouse MD  Procedure: IV Infusion  IV Type: Peripheral, IV Location: L Forearm  Feraheme (Ferumoxytol), Dose: 510 mg  Infusion Start Time: 1336 pm  Infusion Stop Time: 1352 pm  Post Infusion IV Care: Observation period completed and Peripheral IV Discontinued  Discharge: Condition: Good, Destination: Home . AVS Provided  Performed by:  Forrest Moron, RN

## 2022-12-24 NOTE — Patient Instructions (Signed)
Ferumoxytol Injection What is this medication? FERUMOXYTOL (FER ue MOX i tol) treats low levels of iron in your body (iron deficiency anemia). Iron is a mineral that plays an important role in making red blood cells, which carry oxygen from your lungs to the rest of your body. This medicine may be used for other purposes; ask your health care provider or pharmacist if you have questions. COMMON BRAND NAME(S): Feraheme What should I tell my care team before I take this medication? They need to know if you have any of these conditions: Anemia not caused by low iron levels High levels of iron in the blood Magnetic resonance imaging (MRI) test scheduled An unusual or allergic reaction to iron, other medications, foods, dyes, or preservatives Pregnant or trying to get pregnant Breastfeeding How should I use this medication? This medication is injected into a vein. It is given by your care team in a hospital or clinic setting. Talk to your care team the use of this medication in children. Special care may be needed. Overdosage: If you think you have taken too much of this medicine contact a poison control center or emergency room at once. NOTE: This medicine is only for you. Do not share this medicine with others. What if I miss a dose? It is important not to miss your dose. Call your care team if you are unable to keep an appointment. What may interact with this medication? Other iron products This list may not describe all possible interactions. Give your health care provider a list of all the medicines, herbs, non-prescription drugs, or dietary supplements you use. Also tell them if you smoke, drink alcohol, or use illegal drugs. Some items may interact with your medicine. What should I watch for while using this medication? Visit your care team regularly. Tell your care team if your symptoms do not start to get better or if they get worse. You may need blood work done while you are taking this  medication. You may need to follow a special diet. Talk to your care team. Foods that contain iron include: whole grains/cereals, dried fruits, beans, or peas, leafy green vegetables, and organ meats (liver, kidney). What side effects may I notice from receiving this medication? Side effects that you should report to your care team as soon as possible: Allergic reactions--skin rash, itching, hives, swelling of the face, lips, tongue, or throat Low blood pressure--dizziness, feeling faint or lightheaded, blurry vision Shortness of breath Side effects that usually do not require medical attention (report to your care team if they continue or are bothersome): Flushing Headache Joint pain Muscle pain Nausea Pain, redness, or irritation at injection site This list may not describe all possible side effects. Call your doctor for medical advice about side effects. You may report side effects to FDA at 1-800-FDA-1088. Where should I keep my medication? This medication is given in a hospital or clinic. It will not be stored at home. NOTE: This sheet is a summary. It may not cover all possible information. If you have questions about this medicine, talk to your doctor, pharmacist, or health care provider.  2024 Elsevier/Gold Standard (2022-10-09 00:00:00)

## 2022-12-31 ENCOUNTER — Ambulatory Visit (INDEPENDENT_AMBULATORY_CARE_PROVIDER_SITE_OTHER): Payer: Medicare Other

## 2022-12-31 VITALS — BP 142/71 | HR 64 | Temp 98.3°F | Resp 14 | Ht 64.0 in | Wt 182.0 lb

## 2022-12-31 DIAGNOSIS — D509 Iron deficiency anemia, unspecified: Secondary | ICD-10-CM | POA: Diagnosis not present

## 2022-12-31 MED ORDER — DIPHENHYDRAMINE HCL 25 MG PO CAPS: 25.0000 mg | ORAL_CAPSULE | Freq: Once | ORAL | Status: DC

## 2022-12-31 MED ORDER — SODIUM CHLORIDE 0.9 % IV SOLN
510.0000 mg | Freq: Once | INTRAVENOUS | Status: AC
  Administered 2022-12-31: 510 mg via INTRAVENOUS
  Filled 2022-12-31: qty 17

## 2022-12-31 MED ORDER — ACETAMINOPHEN 325 MG PO TABS: 650.0000 mg | ORAL_TABLET | Freq: Once | ORAL | Status: DC

## 2022-12-31 NOTE — Progress Notes (Signed)
Diagnosis: Iron Deficiency Anemia  Provider:  Chilton Greathouse MD  Procedure: IV Infusion  IV Type: Peripheral, IV Location: L Forearm  Feraheme (Ferumoxytol), Dose: 510 mg  Infusion Start Time: 1335  Infusion Stop Time: 1353  Post Infusion IV Care: Observation period completed and Peripheral IV Discontinued  Discharge: Condition: Good, Destination: Home . AVS Declined  Performed by:  Adriana Mccallum, RN

## 2023-01-25 ENCOUNTER — Encounter: Payer: Self-pay | Admitting: Internal Medicine

## 2023-02-09 ENCOUNTER — Ambulatory Visit: Payer: Medicare Other | Admitting: Internal Medicine

## 2023-02-09 ENCOUNTER — Encounter: Payer: Self-pay | Admitting: Internal Medicine

## 2023-02-09 VITALS — BP 136/57 | HR 71 | Temp 98.2°F | Resp 11 | Ht 64.0 in | Wt 179.0 lb

## 2023-02-09 DIAGNOSIS — D123 Benign neoplasm of transverse colon: Secondary | ICD-10-CM | POA: Diagnosis not present

## 2023-02-09 DIAGNOSIS — D509 Iron deficiency anemia, unspecified: Secondary | ICD-10-CM | POA: Diagnosis not present

## 2023-02-09 DIAGNOSIS — K222 Esophageal obstruction: Secondary | ICD-10-CM

## 2023-02-09 DIAGNOSIS — K2100 Gastro-esophageal reflux disease with esophagitis, without bleeding: Secondary | ICD-10-CM | POA: Diagnosis not present

## 2023-02-09 DIAGNOSIS — D122 Benign neoplasm of ascending colon: Secondary | ICD-10-CM | POA: Diagnosis not present

## 2023-02-09 DIAGNOSIS — K2289 Other specified disease of esophagus: Secondary | ICD-10-CM | POA: Diagnosis not present

## 2023-02-09 DIAGNOSIS — K2270 Barrett's esophagus without dysplasia: Secondary | ICD-10-CM

## 2023-02-09 DIAGNOSIS — K209 Esophagitis, unspecified without bleeding: Secondary | ICD-10-CM

## 2023-02-09 MED ORDER — PANTOPRAZOLE SODIUM 40 MG PO TBEC
40.0000 mg | DELAYED_RELEASE_TABLET | Freq: Two times a day (BID) | ORAL | 3 refills | Status: DC
Start: 2023-02-09 — End: 2023-04-07

## 2023-02-09 MED ORDER — SODIUM CHLORIDE 0.9 % IV SOLN: 500.0000 mL | INTRAVENOUS | Status: DC

## 2023-02-09 NOTE — Op Note (Signed)
[facility] Patient Name: Tammy Harrell Procedure Date: 02/09/2023 2:43 PM MRN: [id number] Endoscopist: Beverley Fiedler , MD, [id number] Age: 82 Referring MD:  Date of Birth: July 23, 1940 Gender: Female Account #: [id number] Procedure:                Colonoscopy Indications:              Iron deficiency anemia Medicines:                Monitored Anesthesia Care Procedure:                Pre-Anesthesia Assessment:                           - Prior to the procedure, a History and Physical                            was performed, and patient medications and                            allergies were reviewed. The patient's tolerance of                            previous anesthesia was also reviewed. The risks                            and benefits of the procedure and the sedation                            options and risks were discussed with the patient.                            All questions were answered, and informed consent                            was obtained. Prior Anticoagulants: The patient has                            taken no anticoagulant or antiplatelet agents. ASA                            Grade Assessment: II - A patient with mild systemic                            disease. After reviewing the risks and benefits,                            the patient was deemed in satisfactory condition to                            undergo the procedure.                           After obtaining informed consent, the colonoscope  was passed under direct vision. Throughout the                            procedure, the patient's blood pressure, pulse, and                            oxygen saturations were monitored continuously. The                            [id number] Colonoscope [id number] was introduced                            through the anus and advanced to the cecum,                            identified by appendiceal orifice and  ileocecal                            valve. The colonoscopy was performed without                            difficulty. The patient tolerated the procedure                            well. The quality of the bowel preparation was                            good. The ileocecal valve, appendiceal orifice, and                            rectum were photographed. Scope In: 2:58:50 PM Scope Out: 3:15:30 PM Scope Withdrawal Time: 0 hours 11 minutes 17 seconds  Total Procedure Duration: 0 hours 16 minutes 40 seconds  Findings:                 The digital rectal exam was normal.                           A few small angioectasias with typical arborization                            were found in the cecum.                           A 7 mm polyp was found in the ascending colon. The                            polyp was sessile. The polyp was removed with a                            cold snare. Resection and retrieval were complete.                           A 4 mm polyp was found in the transverse colon. The  polyp was sessile. The polyp was removed with a                            cold snare. Resection and retrieval were complete.                           Multiple medium-mouthed and small-mouthed                            diverticula were found in the sigmoid colon and                            descending colon.                           The retroflexed view of the distal rectum and anal                            verge was normal and showed no anal or rectal                            abnormalities. Complications:            No immediate complications. Estimated Blood Loss:     Estimated blood loss: none. Impression:               - A few colonic angioectasias. This could                            potentially explain iron deficiency.                           - One 7 mm polyp in the ascending colon, removed                            with a cold snare. Resected  and retrieved.                           - One 4 mm polyp in the transverse colon, removed                            with a cold snare. Resected and retrieved.                           - Moderate diverticulosis in the sigmoid colon and                            in the descending colon.                           - The distal rectum and anal verge are normal on                            retroflexion view. Recommendation:           - Patient has  a contact number available for                            emergencies. The signs and symptoms of potential                            delayed complications were discussed with the                            patient. Return to normal activities tomorrow.                            Written discharge instructions were provided to the                            patient.                           - Resume previous diet.                           - Continue present medications.                           - Monitor iron studies and Hgb. If IDA refractory                            to iron replacement repeat colonoscopy in the                            outpatient hospital setting could be considered for                            ablation of cecal angioectasias.                           - Await pathology results.                           - No recommendation at this time regarding repeat                            colonoscopy due to age at next surveillance                            interval. Beverley Fiedler, MD 02/09/2023 3:25:16 PM This report has been signed electronically.

## 2023-02-09 NOTE — Progress Notes (Signed)
Sedate, gd SR, tolerated procedure well, VSS, report to RN 

## 2023-02-09 NOTE — Progress Notes (Signed)
Pt's states no medical or surgical changes since previsit or office visit. 

## 2023-02-09 NOTE — Op Note (Signed)
[facility] Patient Name: Tammy Harrell Procedure Date: 02/09/2023 2:44 PM MRN: [id number] Endoscopist: Beverley Fiedler , MD, [id number] Age: 82 Referring MD:  Date of Birth: [birth year] Gender: Female Account #: [id number] Procedure:                Upper GI endoscopy Indications:              Follow-up of Barrett's esophagus; esophagram                            several months ago showed dysmotility; IDA Medicines:                Monitored Anesthesia Care Procedure:                Pre-Anesthesia Assessment:                           - Prior to the procedure, a History and Physical                            was performed, and patient medications and                            allergies were reviewed. The patient's tolerance of                            previous anesthesia was also reviewed. The risks                            and benefits of the procedure and the sedation                            options and risks were discussed with the patient.                            All questions were answered, and informed consent                            was obtained. Prior Anticoagulants: The patient has                            taken no anticoagulant or antiplatelet agents. ASA                            Grade Assessment: II - A patient with mild systemic                            disease. After reviewing the risks and benefits,                            the patient was deemed in satisfactory condition to                            undergo the procedure.  After obtaining informed consent, the endoscope was                            passed under direct vision. Throughout the                            procedure, the patient's blood pressure, pulse, and                            oxygen saturations were monitored continuously. The                            Olympus Scope [id number] was introduced through the                            mouth, and  advanced to the middle third of                            esophagus. The upper GI endoscopy was accomplished                            without difficulty. The patient tolerated the                            procedure well. Scope In: Scope Out: Findings:                 LA Grade D (one or more mucosal breaks involving at                            least 75% of esophageal circumference) esophagitis                            with no bleeding was found 25 cm from the incisors.                            Biopsies were taken with a cold forceps for                            histology.                           One benign-appearing, intrinsic severe (stenosis;                            an endoscope cannot pass) stenosis was found 25 cm                            from the incisors. This stenosis measured 7 mm                            (inner diameter). The stenosis was not traversed. Complications:            No immediate complications. Estimated Blood Loss:     Estimated blood loss was minimal.  Impression:               - LA Grade D reflux esophagitis with no bleeding.                            Biopsied.                           - Benign-appearing esophageal stenosis past which                            the EGD scope could not pass.                           - Distal esophagus, stomach and duodenum not                            examined today. Recommendation:           - Patient has a contact number available for                            emergencies. The signs and symptoms of potential                            delayed complications were discussed with the                            patient. Return to normal activities tomorrow.                            Written discharge instructions were provided to the                            patient.                           - Resume previous diet.                           - Continue present medications.                           -  Begin pantoprazole 40 mg BID-AC at least until                            next EGD.                           - Await pathology results.                           - Repeat upper endoscopy in 8 weeks to check                            healing. Beverley Fiedler, MD 02/09/2023 3:21:13 PM This report has been signed electronically.

## 2023-02-09 NOTE — Progress Notes (Signed)
Called to room to assist during endoscopic procedure.  Patient ID and intended procedure confirmed with present staff. Received instructions for my participation in the procedure from the performing physician.  

## 2023-02-09 NOTE — Patient Instructions (Addendum)
- Continue present medications. - Begin pantoprazole 40 mg BID-AC at least until next EGD. - Await pathology results. - Repeat upper endoscopy in 8 weeks to check healing. Monitor iron studies and Hgb.                           -  No recommendation at this time regarding repeat  colonoscopy due to age at next surveillance                                                      YOU HAD AN ENDOSCOPIC PROCEDURE TODAY AT THE [facility]:   Refer to the procedure report that was given to you for any specific questions about what was found during the examination.  If the procedure report does not answer your questions, please call your gastroenterologist to clarify.  If you requested that your care partner not be given the details of your procedure findings, then the procedure report has been included in a sealed envelope for you to review at your convenience later.  YOU SHOULD EXPECT: Some feelings of bloating in the abdomen. Passage of more gas than usual.  Walking can help get rid of the air that was put into your GI tract during the procedure and reduce the bloating. If you had a lower endoscopy (such as a colonoscopy or flexible sigmoidoscopy) you may notice spotting of blood in your stool or on the toilet paper. If you underwent a bowel prep for your procedure, you may not have a normal bowel movement for a few days.  Please Note:  You might notice some irritation and congestion in your nose or some drainage.  This is from the oxygen used during your procedure.  There is no need for concern and it should clear up in a day or so.  SYMPTOMS TO REPORT IMMEDIATELY:  Following lower endoscopy (colonoscopy or flexible sigmoidoscopy):  Excessive amounts of blood in the stool  Significant tenderness or worsening of abdominal pains  Swelling of the abdomen that is new, acute  Fever of 100F or higher  Following upper endoscopy (EGD)  Vomiting of blood or coffee ground material  New chest  pain or pain under the shoulder blades  Painful or persistently difficult swallowing  New shortness of breath  Fever of 100F or higher  Black, tarry-looking stools  For urgent or emergent issues, a gastroenterologist can be reached at any hour by calling [phone]. Do not use MyChart messaging for urgent concerns.    DIET:  We do recommend a small meal at first, but then you may proceed to your regular diet.  Drink plenty of fluids but you should avoid alcoholic beverages for 24 hours.  ACTIVITY:  You should plan to take it easy for the rest of today and you should NOT DRIVE or use heavy machinery until tomorrow (because of the sedation medicines used during the test).    FOLLOW UP: Our staff will call the number listed on your records the next business day following your procedure.  We will call around 7:15- 8:00 am to check on you and address any questions or concerns that you may have regarding the information given to you following your procedure. If we do not reach you, we will leave a message.  If any biopsies were taken you will be contacted by phone or by letter within the next 1-3 weeks.  Please call us at [phone] if you have not heard about the biopsies in 3 weeks.    SIGNATURES/CONFIDENTIALITY: You and/or your care partner have signed paperwork which will be entered into your electronic medical record.  These signatures attest to the fact that that the information above on your After Visit Summary has been reviewed and is understood.  Full responsibility of the confidentiality of this discharge information lies with you and/or your care-partner.

## 2023-02-09 NOTE — Progress Notes (Signed)
GASTROENTEROLOGY PROCEDURE H&P NOTE   Primary Care Physician: Shawnie Dapper, PA-C    Reason for Procedure:  Iron deficiency anemia, history of Barrett's esophagus and hiatal hernia  Plan:    EGD and colonoscopy  Patient is appropriate for endoscopic procedure(s) in the ambulatory ([facility]) setting.  The nature of the procedure, as well as the risks, benefits, and alternatives were carefully and thoroughly reviewed with the patient. Ample time for discussion and questions allowed. The patient understood, was satisfied, and agreed to proceed.     HPI: Tammy Harrell is a 82 y.o. female who presents for EGD and colonoscopy.  Medical history as below.  Tolerated the prep.  No recent chest pain or shortness of breath.  No abdominal pain today.  Past Medical History:  Diagnosis Date   Anemia    Arthritis    Asthma    Barrett's esophagus    Cataract    Colon polyps    Family history of adverse reaction to anesthesia    sister hard to wake up   GERD (gastroesophageal reflux disease)    Hernia, diaphragmatic, without obstruction    Hiatal hernia    Osteopenia     Past Surgical History:  Procedure Laterality Date   BIOPSY  10/13/2019   Procedure: BIOPSY;  Surgeon: Jeani Hawking, MD;  Location: [facility];  Service: Endoscopy;;   CATARACT EXTRACTION Bilateral    ESOPHAGOGASTRODUODENOSCOPY (EGD) WITH PROPOFOL N/A 10/13/2019   Procedure: ESOPHAGOGASTRODUODENOSCOPY (EGD) WITH PROPOFOL;  Surgeon: Jeani Hawking, MD;  Location: [facility];  Service: Endoscopy;  Laterality: N/A;   MENISCUS REPAIR Left    SAVORY DILATION N/A 10/13/2019   Procedure: SAVORY DILATION;  Surgeon: Jeani Hawking, MD;  Location: [facility];  Service: Endoscopy;  Laterality: N/A;   TUBAL LIGATION      Prior to Admission medications   Medication Sig Start Date End Date Taking? Authorizing Provider  BORON PO Take 1 Dose by mouth daily.   Yes [provider]  Ginger, Zingiber officinalis,  (GINGER PO) Take by mouth. Drinking ginger tea   Yes [provider]  Niacin (VITAMIN B-3 PO) Take 1 Dose by mouth daily.   Yes [provider]  Probiotic Product (PROBIOTIC PO) Take 2 tablets by mouth daily. Gummies, 2 dbioticsifferent pro   Yes [provider]  Vitamin D-Vitamin K (VITAMIN K2-VITAMIN D3 PO) Take 1 Dose by mouth daily.   Yes [provider]  Ascorbic Acid (VITA-C PO) Take 1 Dose by mouth daily.    [provider]  FERROUS SULFATE PO Take 1 Dose by mouth daily.    [provider]    Current Outpatient Medications  Medication Sig Dispense Refill   BORON PO Take 1 Dose by mouth daily.     Ginger, Zingiber officinalis, (GINGER PO) Take by mouth. Drinking ginger tea     Niacin (VITAMIN B-3 PO) Take 1 Dose by mouth daily.     Probiotic Product (PROBIOTIC PO) Take 2 tablets by mouth daily. Gummies, 2 dbioticsifferent pro     Vitamin D-Vitamin K (VITAMIN K2-VITAMIN D3 PO) Take 1 Dose by mouth daily.     Ascorbic Acid (VITA-C PO) Take 1 Dose by mouth daily.     FERROUS SULFATE PO Take 1 Dose by mouth daily.     Current Facility-Administered Medications  Medication Dose Route Frequency Provider Last Rate Last Admin   0.9 %  sodium chloride infusion  500 mL Intravenous Continuous Mariyah Upshaw, Carie Caddy, MD  Allergies as of 02/09/2023 - Review Complete 02/09/2023  Allergen Reaction Noted   Codeine Nausea Only 09/08/2014    Family History  Problem Relation Age of Onset   COPD Mother    Heart disease Mother    Cirrhosis Father    Lung cancer Sister    Breast cancer Sister    Other Brother        bacteria in the blood   Heart disease Maternal Grandmother    Heart disease Maternal Grandfather    Heart disease Paternal Grandmother    Heart disease Paternal Grandfather    Colon cancer Neg Hx    Colon polyps Neg Hx    Esophageal cancer Neg Hx    Rectal cancer Neg Hx    Stomach cancer Neg Hx     Social History    Socioeconomic History   Marital status: Widowed    Spouse name: Not on file   Number of children: 0   Years of education: Not on file   Highest education level: Not on file  Occupational History   Occupation: retired  Tobacco Use   Smoking status: Never   Smokeless tobacco: Never  Vaping Use   Vaping status: Never Used  Substance and Sexual Activity   Alcohol use: No   Drug use: No   Sexual activity: Not on file  Other Topics Concern   Not on file  Social History Narrative   Not on file   Social Determinants of Health   Financial Resource Strain: Low Risk  (09/08/2022)   Received from [facility], [facility]   Overall Financial Resource Strain (CARDIA)    Difficulty of Paying Living Expenses: Not hard at all  Food Insecurity: No Food Insecurity (09/08/2022)   Received from [facility], [facility]   Hunger Vital Sign    Worried About Running Out of Food in the Last Year: Never true    Ran Out of Food in the Last Year: Never true  Transportation Needs: No Transportation Needs (09/08/2022)   Received from [facility], [facility]   PRAPARE - Transportation    Lack of Transportation (Medical): No    Lack of Transportation (Non-Medical): No  Physical Activity: Not on file  Stress: Not on file  Social Connections: Unknown (08/31/2022)   Received from [facility], [facility]   Social Network    Social Network: Not on file  Intimate Partner Violence: Unknown (08/31/2022)   Received from [facility], [facility]   HITS    Physically Hurt: Not on file    Insult or Talk Down To: Not on file    Threaten Physical Harm: Not on file    Scream or Curse: Not on file    Physical Exam: Vital signs in last 24 hours: @BP  (!) 193/73   Pulse 68   Temp 98.2 F (36.8 C) (Temporal)   Ht 5\' 4"  (1.626 m)   Wt 179 lb (81.2 kg)   SpO2 98%   BMI 30.73 kg/m  GEN: NAD EYE: Sclerae anicteric ENT: MMM CV: Non-tachycardic Pulm: CTA b/l GI: Soft, NT/ND NEURO:   Alert & Oriented x 3   Tammy Blinks, MD [facility]  02/09/2023 2:38 PM

## 2023-02-10 ENCOUNTER — Telehealth: Payer: Self-pay

## 2023-02-10 NOTE — Telephone Encounter (Signed)
  Follow up Call-     02/09/2023    1:51 PM  Call back number  Post procedure Call Back phone  # [phone]  Permission to leave phone message Yes     Patient questions:  Do you have a fever, pain , or abdominal swelling? No. Pain Score  0 *  Have you tolerated food without any problems? Yes.    Have you been able to return to your normal activities? Yes.    Do you have any questions about your discharge instructions: Diet   No. Medications  No. Follow up visit  No.  Do you have questions or concerns about your Care? No.  Actions: * If pain score is 4 or above: No action needed, pain <4.

## 2023-02-11 ENCOUNTER — Encounter: Payer: Self-pay | Admitting: Internal Medicine

## 2023-02-12 LAB — SURGICAL PATHOLOGY

## 2023-02-24 ENCOUNTER — Encounter: Payer: Self-pay | Admitting: Internal Medicine

## 2023-03-26 ENCOUNTER — Ambulatory Visit: Payer: Medicare Other | Admitting: Internal Medicine

## 2023-04-07 ENCOUNTER — Other Ambulatory Visit: Payer: Self-pay

## 2023-04-07 ENCOUNTER — Encounter: Payer: Self-pay | Admitting: Internal Medicine

## 2023-04-07 ENCOUNTER — Other Ambulatory Visit (HOSPITAL_BASED_OUTPATIENT_CLINIC_OR_DEPARTMENT_OTHER): Payer: Self-pay

## 2023-04-07 ENCOUNTER — Ambulatory Visit: Payer: Medicare Other | Admitting: Internal Medicine

## 2023-04-07 VITALS — BP 171/71 | HR 67 | Temp 97.1°F | Resp 13 | Ht 64.0 in | Wt 179.0 lb

## 2023-04-07 DIAGNOSIS — K222 Esophageal obstruction: Secondary | ICD-10-CM

## 2023-04-07 DIAGNOSIS — K2100 Gastro-esophageal reflux disease with esophagitis, without bleeding: Secondary | ICD-10-CM | POA: Diagnosis not present

## 2023-04-07 DIAGNOSIS — K22719 Barrett's esophagus with dysplasia, unspecified: Secondary | ICD-10-CM

## 2023-04-07 DIAGNOSIS — K209 Esophagitis, unspecified without bleeding: Secondary | ICD-10-CM

## 2023-04-07 DIAGNOSIS — K2270 Barrett's esophagus without dysplasia: Secondary | ICD-10-CM

## 2023-04-07 DIAGNOSIS — K449 Diaphragmatic hernia without obstruction or gangrene: Secondary | ICD-10-CM

## 2023-04-07 MED ORDER — PANTOPRAZOLE SODIUM 40 MG PO TBEC
40.0000 mg | DELAYED_RELEASE_TABLET | Freq: Every day | ORAL | 3 refills | Status: DC
  Filled 2023-04-07: qty 90, 90d supply, fill #0

## 2023-04-07 MED ORDER — SODIUM CHLORIDE 0.9 % IV SOLN: 500.0000 mL | Freq: Once | INTRAVENOUS | Status: DC

## 2023-04-07 NOTE — Progress Notes (Signed)
GASTROENTEROLOGY PROCEDURE H&P NOTE   Primary Care Physician: Shawnie Dapper, PA-C    Reason for Procedure:  Follow-up of acute esophagitis  Plan:    EGD  Patient is appropriate for endoscopic procedure(s) in the ambulatory ([facility]) setting.  The nature of the procedure, as well as the risks, benefits, and alternatives were carefully and thoroughly reviewed with the patient. Ample time for discussion and questions allowed. The patient understood, was satisfied, and agreed to proceed.     HPI: Tammy Harrell is a 82 y.o. female who presents for EGD.  Medical history as below.  No recent chest pain or shortness of breath.  No abdominal pain today.  Past Medical History:  Diagnosis Date   Anemia    Arthritis    Asthma    Barrett's esophagus    Cataract    Colon polyps    Family history of adverse reaction to anesthesia    sister hard to wake up   GERD (gastroesophageal reflux disease)    Hernia, diaphragmatic, without obstruction    Hiatal hernia    Osteopenia     Past Surgical History:  Procedure Laterality Date   BIOPSY  10/13/2019   Procedure: BIOPSY;  Surgeon: Jeani Hawking, MD;  Location: [facility];  Service: Endoscopy;;   CATARACT EXTRACTION Bilateral    ESOPHAGOGASTRODUODENOSCOPY (EGD) WITH PROPOFOL N/A 10/13/2019   Procedure: ESOPHAGOGASTRODUODENOSCOPY (EGD) WITH PROPOFOL;  Surgeon: Jeani Hawking, MD;  Location: [facility];  Service: Endoscopy;  Laterality: N/A;   MENISCUS REPAIR Left    SAVORY DILATION N/A 10/13/2019   Procedure: SAVORY DILATION;  Surgeon: Jeani Hawking, MD;  Location: [facility];  Service: Endoscopy;  Laterality: N/A;   TUBAL LIGATION      Prior to Admission medications   Medication Sig Start Date End Date Taking? Authorizing Provider  FERROUS SULFATE PO Take 1 Dose by mouth daily.   Yes [provider]  pantoprazole (PROTONIX) 40 MG tablet Take 1 tablet (40 mg total) by mouth 2 (two) times daily before a meal. 02/09/23   Yes Emmeline Winebarger, Carie Caddy, MD  Ascorbic Acid (VITA-C PO) Take 1 Dose by mouth daily.    [provider]  BORON PO Take 1 Dose by mouth daily.    [provider]  Ginger, Zingiber officinalis, (GINGER PO) Take by mouth. Drinking ginger tea Patient not taking: Reported on 04/07/2023    [provider]  Niacin (VITAMIN B-3 PO) Take 1 Dose by mouth daily.    [provider]  Probiotic Product (PROBIOTIC PO) Take 2 tablets by mouth daily. Gummies, 2 dbioticsifferent pro    [provider]  Vitamin D-Vitamin K (VITAMIN K2-VITAMIN D3 PO) Take 1 Dose by mouth daily.    [provider]    Current Outpatient Medications  Medication Sig Dispense Refill   FERROUS SULFATE PO Take 1 Dose by mouth daily.     pantoprazole (PROTONIX) 40 MG tablet Take 1 tablet (40 mg total) by mouth 2 (two) times daily before a meal. 60 tablet 3   Ascorbic Acid (VITA-C PO) Take 1 Dose by mouth daily.     BORON PO Take 1 Dose by mouth daily.     Ginger, Zingiber officinalis, (GINGER PO) Take by mouth. Drinking ginger tea (Patient not taking: Reported on 04/07/2023)     Niacin (VITAMIN B-3 PO) Take 1 Dose by mouth daily.     Probiotic Product (PROBIOTIC PO) Take 2 tablets by mouth daily. Gummies, 2 dbioticsifferent pro  Vitamin D-Vitamin K (VITAMIN K2-VITAMIN D3 PO) Take 1 Dose by mouth daily.     Current Facility-Administered Medications  Medication Dose Route Frequency Provider Last Rate Last Admin   0.9 %  sodium chloride infusion  500 mL Intravenous Once Dontay Harm, Carie Caddy, MD        Allergies as of 04/07/2023 - Review Complete 04/07/2023  Allergen Reaction Noted   Codeine Nausea Only 09/08/2014    Family History  Problem Relation Age of Onset   COPD Mother    Heart disease Mother    Cirrhosis Father    Lung cancer Sister    Breast cancer Sister    Other Brother        bacteria in the blood   Heart disease Maternal Grandmother    Heart disease Maternal Grandfather     Heart disease Paternal Grandmother    Heart disease Paternal Grandfather    Colon cancer Neg Hx    Colon polyps Neg Hx    Esophageal cancer Neg Hx    Rectal cancer Neg Hx    Stomach cancer Neg Hx     Social History   Socioeconomic History   Marital status: Widowed    Spouse name: Not on file   Number of children: 0   Years of education: Not on file   Highest education level: Not on file  Occupational History   Occupation: retired  Tobacco Use   Smoking status: Never   Smokeless tobacco: Never  Vaping Use   Vaping status: Never Used  Substance and Sexual Activity   Alcohol use: No   Drug use: No   Sexual activity: Not on file  Other Topics Concern   Not on file  Social History Narrative   Not on file   Social Determinants of Health   Financial Resource Strain: Low Risk  (09/08/2022)   Received from [facility], [facility]   Overall Financial Resource Strain (CARDIA)    Difficulty of Paying Living Expenses: Not hard at all  Food Insecurity: No Food Insecurity (09/08/2022)   Received from [facility], [facility]   Hunger Vital Sign    Worried About Running Out of Food in the Last Year: Never true    Ran Out of Food in the Last Year: Never true  Transportation Needs: No Transportation Needs (09/08/2022)   Received from [facility], [facility]   PRAPARE - Transportation    Lack of Transportation (Medical): No    Lack of Transportation (Non-Medical): No  Physical Activity: Not on file  Stress: Not on file  Social Connections: Unknown (08/31/2022)   Received from [facility], [facility]   Social Network    Social Network: Not on file  Intimate Partner Violence: Unknown (08/31/2022)   Received from [facility], [facility]   HITS    Physically Hurt: Not on file    Insult or Talk Down To: Not on file    Threaten Physical Harm: Not on file    Scream or Curse: Not on file    Physical Exam: Vital signs in last 24 hours: @BP  (!) 197/88    Pulse 63   Temp (!) 97.1 F (36.2 C)   Ht 5\' 4"  (1.626 m)   Wt 179 lb (81.2 kg)   SpO2 99%   BMI 30.73 kg/m  GEN: NAD EYE: Sclerae anicteric ENT: MMM CV: Non-tachycardic Pulm: CTA b/l GI: Soft, NT/ND NEURO:  Alert & Oriented x 3   Erick Blinks, MD [facility]  04/07/2023 9:53 AM

## 2023-04-07 NOTE — Op Note (Signed)
[facility] Patient Name: Tammy Harrell Procedure Date: 04/07/2023 9:55 AM MRN: [id number] Endoscopist: Beverley Fiedler , MD, [id number] Age: 82 Referring MD:  Date of Birth: [birth year] Gender: Female Account #: [id number] Procedure:                Upper GI endoscopy Indications:              Follow-up of reflux esophagitis seen at EGD on                            02/09/2023 (severe and ulcerative at 25 cm, EGD                            scope could not traverse on that day); pantoprazole                            40 mg BID since last EGD, subjective improvement in                            dysphagia and heartburn (several isolated                            breakthrough heartburn episodes) Medicines:                Monitored Anesthesia Care Procedure:                Pre-Anesthesia Assessment:                           - Prior to the procedure, a History and Physical                            was performed, and patient medications and                            allergies were reviewed. The patient's tolerance of                            previous anesthesia was also reviewed. The risks                            and benefits of the procedure and the sedation                            options and risks were discussed with the patient.                            All questions were answered, and informed consent                            was obtained. Prior Anticoagulants: The patient has                            taken no anticoagulant or antiplatelet agents. ASA  Grade Assessment: II - A patient with mild systemic                            disease. After reviewing the risks and benefits,                            the patient was deemed in satisfactory condition to                            undergo the procedure.                           After obtaining informed consent, the endoscope was                            passed under direct vision.  Throughout the                            procedure, the patient's blood pressure, pulse, and                            oxygen saturations were monitored continuously. The                            GIF [id number] #[id number] was introduced through the                            mouth, and advanced to the second part of duodenum.                            The upper GI endoscopy was accomplished without                            difficulty. The patient tolerated the procedure                            well. Scope In: Scope Out: Findings:                 One benign-appearing, intrinsic moderate                            (circumferential scarring or stenosis; an endoscope                            may pass) stenosis was found 25 cm from the                            incisors. This stenosis measured 1.4 cm (inner                            diameter) x less than one cm (in length).                           The esophagus and gastroesophageal  junction were                            examined with white light and narrow band imaging                            (NBI) from a forward view and retroflexed position.                            There were esophageal mucosal changes consistent                            with long-segment Barrett's esophagus. These                            changes involved the mucosa at the upper extent of                            the gastric folds (32 cm from the incisors)                            extending to the Z-line (26 cm from the incisors).                            Circumferential salmon-colored mucosa was present                            from 26 to 32 cm. The maximum longitudinal extent                            of these esophageal mucosal changes was 6 cm in                            length. Mucosa was biopsied with a cold forceps for                            histology in a targeted manner and in 4 quadrants                            at intervals  of 2 cm. A total of 4 specimen bottles                            were sent to pathology.                           A large hiatal hernia was found. The proximal                            extent of the gastric folds (end of tubular                            esophagus) was 32 cm from the incisors. The hiatal  narrowing was 40 cm from the incisors.                           The gastroesophageal flap valve was visualized                            endoscopically and classified as Hill Grade IV (no                            fold, wide open lumen, hiatal hernia present).                           The entire examined stomach was normal.                           The examined duodenum was normal. Complications:            No immediate complications. Estimated Blood Loss:     Estimated blood loss was minimal. Impression:               - Esophageal mucosal changes consistent with                            long-segment Barrett's esophagus. Biopsied.                           - Benign-appearing esophageal stenosis is much                            improved with BID PPI and no longer obstructing.                           - Large hiatal hernia.                           - Normal stomach.                           - Normal examined duodenum. Recommendation:           - Patient has a contact number available for                            emergencies. The signs and symptoms of potential                            delayed complications were discussed with the                            patient. Return to normal activities tomorrow.                            Written discharge instructions were provided to the                            patient.                           -  Resume previous diet.                           - Continue present medications. Indefinite                            pantoprazole 40 mg twice daily given large hiatal                            hernia,  history of esophagitis and Barrett's                            esophagus. Reflux precautions (try to avoid lying                            down within 2 hours of solid foods and 1 hour of                            liquids).                           - Await pathology results.                           - Routine office follow-up for continuity. Beverley Fiedler, MD 04/07/2023 10:21:40 AM This report has been signed electronically.

## 2023-04-07 NOTE — Patient Instructions (Signed)
Resume previous diet and medications. Awaiting pathology results. Indefinite Pantoprazole 40 mg twice daily given large hiatal hernia. Follow reflux precautions, avoid lying down within 2 hours of eating solid foods and 1 hours for liquids.  YOU HAD AN ENDOSCOPIC PROCEDURE TODAY AT THE [facility]:   Refer to the procedure report that was given to you for any specific questions about what was found during the examination.  If the procedure report does not answer your questions, please call your gastroenterologist to clarify.  If you requested that your care partner not be given the details of your procedure findings, then the procedure report has been included in a sealed envelope for you to review at your convenience later.  YOU SHOULD EXPECT: Some feelings of bloating in the abdomen. Passage of more gas than usual.  Walking can help get rid of the air that was put into your GI tract during the procedure and reduce the bloating. If you had a lower endoscopy (such as a colonoscopy or flexible sigmoidoscopy) you may notice spotting of blood in your stool or on the toilet paper. If you underwent a bowel prep for your procedure, you may not have a normal bowel movement for a few days.  Please Note:  You might notice some irritation and congestion in your nose or some drainage.  This is from the oxygen used during your procedure.  There is no need for concern and it should clear up in a day or so.  SYMPTOMS TO REPORT IMMEDIATELY:  Following upper endoscopy (EGD)  Vomiting of blood or coffee ground material  New chest pain or pain under the shoulder blades  Painful or persistently difficult swallowing  New shortness of breath  Fever of 100F or higher  Black, tarry-looking stools  For urgent or emergent issues, a gastroenterologist can be reached at any hour by calling [phone]. Do not use MyChart messaging for urgent concerns.    DIET:  We do recommend a small meal at first,  but then you may proceed to your regular diet.  Drink plenty of fluids but you should avoid alcoholic beverages for 24 hours.  ACTIVITY:  You should plan to take it easy for the rest of today and you should NOT DRIVE or use heavy machinery until tomorrow (because of the sedation medicines used during the test).    FOLLOW UP: Our staff will call the number listed on your records the next business day following your procedure.  We will call around 7:15- 8:00 am to check on you and address any questions or concerns that you may have regarding the information given to you following your procedure. If we do not reach you, we will leave a message.     If any biopsies were taken you will be contacted by phone or by letter within the next 1-3 weeks.  Please call us at [phone] if you have not heard about the biopsies in 3 weeks.    SIGNATURES/CONFIDENTIALITY: You and/or your care partner have signed paperwork which will be entered into your electronic medical record.  These signatures attest to the fact that that the information above on your After Visit Summary has been reviewed and is understood.  Full responsibility of the confidentiality of this discharge information lies with you and/or your care-partner.

## 2023-04-07 NOTE — Progress Notes (Signed)
Called to room to assist during endoscopic procedure.  Patient ID and intended procedure confirmed with present staff. Received instructions for my participation in the procedure from the performing physician.  

## 2023-04-07 NOTE — Progress Notes (Signed)
Report to PACU, RN, vss, BBS= Clear.  

## 2023-04-08 ENCOUNTER — Telehealth: Payer: Self-pay | Admitting: *Deleted

## 2023-04-08 NOTE — Telephone Encounter (Signed)
  Follow up Call-     04/07/2023    9:28 AM 02/09/2023    1:51 PM  Call back number  Post procedure Call Back phone  # [phone]  Permission to leave phone message Yes Yes     Patient questions:  Do you have a fever, pain , or abdominal swelling? No. Pain Score  0 *  Have you tolerated food without any problems? Yes.    Have you been able to return to your normal activities? Yes.    Do you have any questions about your discharge instructions: Diet   No. Medications  No. Follow up visit  No.  Do you have questions or concerns about your Care? No.  Actions: * If pain score is 4 or above: No action needed, pain <4.

## 2023-04-09 ENCOUNTER — Encounter: Payer: Self-pay | Admitting: Internal Medicine

## 2023-04-09 LAB — SURGICAL PATHOLOGY

## 2023-06-30 ENCOUNTER — Ambulatory Visit: Payer: Medicare Other | Admitting: Physician Assistant

## 2023-07-04 ENCOUNTER — Other Ambulatory Visit: Payer: Self-pay | Admitting: Internal Medicine

## 2023-07-04 DIAGNOSIS — K209 Esophagitis, unspecified without bleeding: Secondary | ICD-10-CM

## 2023-08-03 ENCOUNTER — Other Ambulatory Visit (HOSPITAL_BASED_OUTPATIENT_CLINIC_OR_DEPARTMENT_OTHER): Payer: Self-pay

## 2023-08-03 ENCOUNTER — Other Ambulatory Visit: Payer: Self-pay | Admitting: Internal Medicine

## 2023-08-03 DIAGNOSIS — K209 Esophagitis, unspecified without bleeding: Secondary | ICD-10-CM

## 2023-08-03 MED ORDER — PANTOPRAZOLE SODIUM 40 MG PO TBEC
40.0000 mg | DELAYED_RELEASE_TABLET | Freq: Two times a day (BID) | ORAL | 1 refills | Status: DC
  Filled 2023-08-03: qty 180, 90d supply, fill #0
  Filled 2023-11-08: qty 180, 90d supply, fill #1

## 2023-11-09 ENCOUNTER — Other Ambulatory Visit (HOSPITAL_BASED_OUTPATIENT_CLINIC_OR_DEPARTMENT_OTHER): Payer: Self-pay

## 2024-02-18 ENCOUNTER — Other Ambulatory Visit: Payer: Self-pay | Admitting: Internal Medicine

## 2024-02-18 DIAGNOSIS — K209 Esophagitis, unspecified without bleeding: Secondary | ICD-10-CM

## 2024-02-21 ENCOUNTER — Other Ambulatory Visit (HOSPITAL_BASED_OUTPATIENT_CLINIC_OR_DEPARTMENT_OTHER): Payer: Self-pay

## 2024-02-21 MED ORDER — PANTOPRAZOLE SODIUM 40 MG PO TBEC
40.0000 mg | DELAYED_RELEASE_TABLET | Freq: Two times a day (BID) | ORAL | 1 refills | Status: DC
  Filled 2024-02-21: qty 60, 30d supply, fill #0
  Filled 2024-03-20: qty 60, 30d supply, fill #1

## 2024-04-05 NOTE — Progress Notes (Unsigned)
 Chief Complaint:med refill Primary GI MD: Dr. Albertus  HPI: Discussed the use of AI scribe software for clinical note transcription with the patient, who gave verbal consent to proceed.  Tammy Harrell is an 83 year old female with Barrett's esophagus, reflux with esophagitis, and a large hiatal hernia who presents with persistent reflux symptoms.  She experiences reflux symptoms at least a couple of times a week despite taking pantoprazole  twice daily. Initially, reflux occurred only at night when lying down, but now it also happens during the day when she reclines in her lift chair after lunch. She describes the chair as reclined at a 45-degree angle, which she believes might contribute to her symptoms. She is concerned about the long-term safety of pantoprazole .  She has a history of Barrett's esophagus, reflux with esophagitis, and a large hiatal hernia. Despite long-term use of pantoprazole , she continues to experience symptoms.  She experiences bowel incontinence, which she attributes to pantoprazole  use. She describes episodes of diarrhea and incontinence, sometimes without warning. She has not tried fiber supplements but is considering increasing dietary fiber intake to manage these symptoms.  She has arthritis in all four compartments of her knee and uses topical treatments like Voltaren gel. She avoids ibuprofen due to concerns about its impact on her reflux and esophagus. She finds relief from pain using 'vibe freeze pads' and heat, which she prefers over oral NSAIDs.    PREVIOUS GI WORKUP   EGD 03/2023 for IDA - Esophageal mucosal changes consistent with long- segment Barrett' s esophagus. Biopsied.  - Benign- appearing esophageal stenosis is much improved with BID PPI and no longer obstructing. - Large hiatal hernia.  - Normal stomach.  - Normal examined duodenum.  INAL DIAGNOSIS        1. Surgical [P], esophagus at 32 cm :       - BARRETT MUCOSA, NO DYSPLASIA         2. Surgical [P], esophagus at 30 cm :       - BARRETT MUCOSA, NO DYSPLASIA        3. Surgical [P], esophagus at 28 cm :       - BARRETT MUCOSA, NO DYSPLASIA        4. Surgical [P], esophagus at 26 cm :       - BARRETT MUCOSA, NO DYSPLASIA    Colonoscopy 01/2023 - A few colonic angioectasias. This could potentially explain iron deficiency.  - One 7 mm polyp (TA) in the ascending colon, removed with a cold snare. Resected and retrieved.  - One 4 mm polyp (TA) in the transverse colon, removed with a cold snare. Resected and retrieved.  - Moderate diverticulosis in the sigmoid colon and in the descending colon.  - The distal rectum and anal verge are normal on retroflexion view.   Past Medical History:  Diagnosis Date   Anemia    Arthritis    Asthma    Barrett's esophagus    Cataract    Colon polyps    Family history of adverse reaction to anesthesia    sister hard to wake up   GERD (gastroesophageal reflux disease)    Hernia, diaphragmatic, without obstruction    Hiatal hernia    Osteopenia     Past Surgical History:  Procedure Laterality Date   BIOPSY  10/13/2019   Procedure: BIOPSY;  Surgeon: Rollin Dover, MD;  Location: [facility];  Service: Endoscopy;;   CATARACT EXTRACTION Bilateral    ESOPHAGOGASTRODUODENOSCOPY (EGD) WITH PROPOFOL   N/A 10/13/2019   Procedure: ESOPHAGOGASTRODUODENOSCOPY (EGD) WITH PROPOFOL ;  Surgeon: Rollin Dover, MD;  Location: [facility];  Service: Endoscopy;  Laterality: N/A;   MENISCUS REPAIR Left    SAVORY DILATION N/A 10/13/2019   Procedure: SAVORY DILATION;  Surgeon: Rollin Dover, MD;  Location: [facility];  Service: Endoscopy;  Laterality: N/A;   TUBAL LIGATION      Current Outpatient Medications  Medication Sig Dispense Refill   BORON PO Take 1 Dose by mouth daily.     IRON-B12-VIT C-FA-IFC PO Take by mouth.     Niacin (VITAMIN B-3 PO) Take 1 Dose by mouth daily.     Vitamin D-Vitamin K (VITAMIN K2-VITAMIN D3 PO) Take 1 Dose by  mouth daily.     VOQUEZNA 10 MG TABS Take 1 tablet (10 mg) by mouth daily. 30 tablet 3   pantoprazole  (PROTONIX ) 40 MG tablet Take 1 tablet (40 mg total) by mouth 2 (two) times daily. Please schedule a yearly follow up for further refills. Thank you (Patient not taking: Reported on 04/06/2024) 60 tablet 1   No current facility-administered medications for this visit.    Allergies as of 04/06/2024 - Review Complete 04/06/2024  Allergen Reaction Noted   Codeine Nausea Only 09/08/2014    Family History  Problem Relation Age of Onset   COPD Mother    Heart disease Mother    Cirrhosis Father    Lung cancer Sister    Breast cancer Sister    Other Brother        bacteria in the blood   Heart disease Maternal Grandmother    Heart disease Maternal Grandfather    Heart disease Paternal Grandmother    Heart disease Paternal Grandfather    Colon cancer Neg Hx    Colon polyps Neg Hx    Esophageal cancer Neg Hx    Rectal cancer Neg Hx    Stomach cancer Neg Hx     Social History   Socioeconomic History   Marital status: Widowed    Spouse name: Not on file   Number of children: 0   Years of education: Not on file   Highest education level: Not on file  Occupational History   Occupation: retired  Tobacco Use   Smoking status: Never   Smokeless tobacco: Never  Vaping Use   Vaping status: Never Used  Substance and Sexual Activity   Alcohol use: No   Drug use: No   Sexual activity: Not on file  Other Topics Concern   Not on file  Social History Narrative   Not on file   Social Drivers of Health   Financial Resource Strain: Low Risk  (09/08/2022)   Received from [facility]   Overall Financial Resource Strain (CARDIA)    Difficulty of Paying Living Expenses: Not hard at all  Food Insecurity: No Food Insecurity (09/08/2022)   [facility]   Hunger Vital Sign    Within the past 12 months, you worried that your food would run out before you got the money to buy  more.: Never true    Within the past 12 months, the food you bought just didn't last and you didn't have money to get more.: Never true  Transportation Needs: No Transportation Needs (09/08/2022)   Received from [facility] - Transportation    Lack of Transportation (Medical): No    Lack of Transportation (Non-Medical): No  Physical Activity: Not on file  Stress: Not on file  Social  Connections: Unknown (08/31/2022)   Received from [facility]   Social Network    Social Network: Not on file  Intimate Partner Violence: Unknown (08/31/2022)   Received from [facility]   HITS    Physically Hurt: Not on file    Insult or Talk Down To: Not on file    Threaten Physical Harm: Not on file    Scream or Curse: Not on file    Review of Systems:    Constitutional: No weight loss, fever, chills, weakness or fatigue HEENT: Eyes: No change in vision               Ears, Nose, Throat:  No change in hearing or congestion Skin: No rash or itching Cardiovascular: No chest pain, chest pressure or palpitations   Respiratory: No SOB or cough Gastrointestinal: See HPI and otherwise negative Genitourinary: No dysuria or change in urinary frequency Neurological: No headache, dizziness or syncope Musculoskeletal: No new muscle or joint pain Hematologic: No bleeding or bruising Psychiatric: No history of depression or anxiety    Physical Exam:  Vital signs: BP (!) 152/92   Pulse 75   Ht 5' 4 (1.626 m)   Wt 200 lb (90.7 kg)   SpO2 98%   BMI 34.33 kg/m   Constitutional: NAD, alert and cooperative Head:  Normocephalic and atraumatic. Eyes:   PEERL, EOMI. No icterus. Conjunctiva pink. Respiratory: Respirations even and unlabored. Lungs clear to auscultation bilaterally.   No wheezes, crackles, or rhonchi.  Cardiovascular:  Regular rate and rhythm. No peripheral edema, cyanosis or pallor.  Gastrointestinal:  Soft, nondistended, nontender. No rebound or guarding. Normal bowel sounds.  No appreciable masses or hepatomegaly. Rectal:  Declines Msk:  Symmetrical without gross deformities. Without edema, no deformity or joint abnormality.  Neurologic:  Alert and  oriented x4;  grossly normal neurologically.  Skin:   Dry and intact without significant lesions or rashes. Psychiatric: Oriented to person, place and time. Demonstrates good judgement and reason without abnormal affect or behaviors.   RELEVANT LABS AND IMAGING: CBC    Component Value Date/Time   WBC 6.1 12/01/2022 2108   RBC 4.20 12/01/2022 2108   HGB 10.0 (L) 12/01/2022 2108   HCT 31.2 (L) 12/01/2022 2108   PLT 348 12/01/2022 2108   MCV 74.3 (L) 12/01/2022 2108   MCH 23.8 (L) 12/01/2022 2108   MCHC 32.1 12/01/2022 2108   RDW 18.3 (H) 12/01/2022 2108   LYMPHSABS 0.8 12/01/2022 2108   MONOABS 0.2 12/01/2022 2108   EOSABS 0.0 12/01/2022 2108   BASOSABS 0.0 12/01/2022 2108    CMP     Component Value Date/Time   NA 135 12/01/2022 2108   K 3.9 12/01/2022 2108   CL 101 12/01/2022 2108   CO2 22 12/01/2022 2108   GLUCOSE 116 (H) 12/01/2022 2108   BUN 19 12/01/2022 2108   CREATININE 0.74 12/01/2022 2108   CALCIUM 9.0 12/01/2022 2108   PROT 6.7 12/01/2022 2108   ALBUMIN 3.1 (L) 12/01/2022 2108   AST 19 12/01/2022 2108   ALT 17 12/01/2022 2108   ALKPHOS 98 12/01/2022 2108   BILITOT 0.5 12/01/2022 2108   GFRNONAA >60 12/01/2022 2108     Assessment/Plan:   83 year old female history of GERD, Barrett's esophagus without dysplasia, large hiatal hernia, remote colon polyps, osteopenia, arthritis, presents for medication refill  Barrett's esophagus GERD with esophagitis Hiatal hernia EGD 03/2023 with improved esophagitis, large hiatal hernia, Barrett's esophagus. Continued daily symptoms despite PPI BID. Patient planning to try  massage therapy referred by a friend. - educated patient on lifestyle modifications and provided patient education handout - Trial of voquezna  10mg  every day (samples  provided) -- follow up with me in 6-8 weeks  IDA Colonoscopy 01/2023 with 2 polyps, diverticulosis, few cecal angioectasias.  EGD 03/2023 with improved esophageal stenosis, long segment Barrett's, large hiatal hernia.  Recent Hgb 14.0.  Adequate iron studies - If recurrence of iron deficiency anemia or any overt bleeding consider hospital setting colonoscopy for ablation of known cecal angioectasias  Fecal incontinence with diarrhea Fecal incontinence with diarrhea, possibly exacerbated by pantoprazole . Discussed dietary fiber's role in stool bulking and incontinence prevention. She is willing to increase dietary fiber intake. - Increase dietary fiber intake with flax seeds, chia seeds, and prunes. - Consider fiber supplements like Benefiber or Metamucil if dietary changes are insufficient. - Discuss pelvic floor physical therapy if incontinence persists. -- UTD on colonoscopy   [facility] 04/06/2024, 12:48 PM  Cc: Tammy Morene CROME, PA-C

## 2024-04-06 ENCOUNTER — Encounter: Payer: Self-pay | Admitting: Gastroenterology

## 2024-04-06 ENCOUNTER — Other Ambulatory Visit (HOSPITAL_BASED_OUTPATIENT_CLINIC_OR_DEPARTMENT_OTHER): Payer: Self-pay

## 2024-04-06 ENCOUNTER — Ambulatory Visit: Admitting: Gastroenterology

## 2024-04-06 VITALS — BP 152/92 | HR 75 | Ht 64.0 in | Wt 200.0 lb

## 2024-04-06 DIAGNOSIS — R159 Full incontinence of feces: Secondary | ICD-10-CM

## 2024-04-06 DIAGNOSIS — K449 Diaphragmatic hernia without obstruction or gangrene: Secondary | ICD-10-CM | POA: Diagnosis not present

## 2024-04-06 DIAGNOSIS — D509 Iron deficiency anemia, unspecified: Secondary | ICD-10-CM | POA: Diagnosis not present

## 2024-04-06 DIAGNOSIS — R197 Diarrhea, unspecified: Secondary | ICD-10-CM

## 2024-04-06 DIAGNOSIS — K2270 Barrett's esophagus without dysplasia: Secondary | ICD-10-CM | POA: Diagnosis not present

## 2024-04-06 DIAGNOSIS — K209 Esophagitis, unspecified without bleeding: Secondary | ICD-10-CM

## 2024-04-06 DIAGNOSIS — K2100 Gastro-esophageal reflux disease with esophagitis, without bleeding: Secondary | ICD-10-CM

## 2024-04-06 MED ORDER — VOQUEZNA 10 MG PO TABS
10.0000 mg | ORAL_TABLET | Freq: Every day | ORAL | 3 refills | Status: AC
  Filled 2024-04-06: qty 30, 30d supply, fill #0

## 2024-04-06 NOTE — Patient Instructions (Addendum)
 _______________________________________________________  If your blood pressure at your visit was 140/90 or greater, please contact your primary care physician to follow up on this.  _______________________________________________________  If you are age 81 or older, your body mass index should be between 23-30. Your Body mass index is 34.33 kg/m. If this is out of the aforementioned range listed, please consider follow up with your Primary Care Provider.  If you are age 69 or younger, your body mass index should be between 19-25. Your Body mass index is 34.33 kg/m. If this is out of the aformentioned range listed, please consider follow up with your Primary Care Provider.   ________________________________________________________  The  GI providers would like to encourage you to use MYCHART to communicate with providers for non-urgent requests or questions.  Due to long hold times on the telephone, sending your provider a message by [facility] may be a faster and more efficient way to get a response.  Please allow 48 business hours for a response.  Please remember that this is for non-urgent requests.  _______________________________________________________  [facility] is using a team-based approach to care.  Your team is made up of your doctor and two to three APPS. Our APPS (Nurse Practitioners and Physician Assistants) work with your physician to ensure care continuity for you. They are fully qualified to address your health concerns and develop a treatment plan. They communicate directly with your gastroenterologist to care for you. Seeing the Advanced Practice Practitioners on your physician's team can help you by facilitating care more promptly, often allowing for earlier appointments, access to diagnostic testing, procedures, and other specialty referrals.   We have sent the following medications to your pharmacy for you to pick up at your convenience: Voquezna  You  have been scheduled for an appointment with Nestor Blower on 05-26-24 at  230pm . Please arrive 10 minutes early for your appointment.  It was a pleasure to see you today!  Thank you for trusting me with your gastrointestinal care!

## 2024-04-10 ENCOUNTER — Other Ambulatory Visit: Payer: Self-pay

## 2024-04-10 ENCOUNTER — Other Ambulatory Visit (HOSPITAL_BASED_OUTPATIENT_CLINIC_OR_DEPARTMENT_OTHER): Payer: Self-pay

## 2024-04-10 DIAGNOSIS — K209 Esophagitis, unspecified without bleeding: Secondary | ICD-10-CM

## 2024-04-10 MED ORDER — PANTOPRAZOLE SODIUM 40 MG PO TBEC
40.0000 mg | DELAYED_RELEASE_TABLET | Freq: Two times a day (BID) | ORAL | 3 refills | Status: AC
  Filled 2024-04-10 – 2024-04-12 (×2): qty 180, 90d supply, fill #0

## 2024-04-12 ENCOUNTER — Other Ambulatory Visit (HOSPITAL_BASED_OUTPATIENT_CLINIC_OR_DEPARTMENT_OTHER): Payer: Self-pay

## 2024-05-26 ENCOUNTER — Ambulatory Visit: Admitting: Gastroenterology

## 2024-06-06 ENCOUNTER — Ambulatory Visit: Admitting: Gastroenterology
